# Patient Record
Sex: Female | Born: 1940 | Race: Black or African American | Hispanic: No | Smoking: Former smoker
Health system: Southern US, Community
[De-identification: ages and names within clinical notes are randomized; demographics above are authoritative.]

## PROBLEM LIST (undated history)

## (undated) DIAGNOSIS — I4891 Unspecified atrial fibrillation: Secondary | ICD-10-CM

## (undated) DIAGNOSIS — T753XXA Motion sickness, initial encounter: Secondary | ICD-10-CM

## (undated) DIAGNOSIS — I671 Cerebral aneurysm, nonruptured: Secondary | ICD-10-CM

## (undated) DIAGNOSIS — E785 Hyperlipidemia, unspecified: Secondary | ICD-10-CM

## (undated) DIAGNOSIS — Z972 Presence of dental prosthetic device (complete) (partial): Secondary | ICD-10-CM

## (undated) DIAGNOSIS — I10 Essential (primary) hypertension: Secondary | ICD-10-CM

## (undated) HISTORY — PX: BRAIN SURGERY: SHX531

## (undated) HISTORY — PX: BACK SURGERY: SHX140

## (undated) HISTORY — DX: Unspecified atrial fibrillation: I48.91

## (undated) HISTORY — PX: ABDOMINAL HYSTERECTOMY: SHX81

## (undated) HISTORY — PX: CEREBRAL ANEURYSM REPAIR: SHX164

## (undated) HISTORY — PX: CHOLECYSTECTOMY: SHX55

---

## 1994-10-25 DIAGNOSIS — I671 Cerebral aneurysm, nonruptured: Secondary | ICD-10-CM

## 1994-10-25 HISTORY — DX: Cerebral aneurysm, nonruptured: I67.1

## 2010-08-31 ENCOUNTER — Ambulatory Visit: Payer: Self-pay | Admitting: Internal Medicine

## 2016-07-05 ENCOUNTER — Other Ambulatory Visit (HOSPITAL_COMMUNITY): Payer: Self-pay | Admitting: Nurse Practitioner

## 2016-07-05 DIAGNOSIS — M25551 Pain in right hip: Secondary | ICD-10-CM

## 2016-07-12 ENCOUNTER — Ambulatory Visit (HOSPITAL_COMMUNITY): Payer: Medicare HMO

## 2017-11-25 ENCOUNTER — Emergency Department: Payer: Medicare HMO

## 2017-11-25 ENCOUNTER — Other Ambulatory Visit: Payer: Self-pay

## 2017-11-25 ENCOUNTER — Encounter: Payer: Self-pay | Admitting: Emergency Medicine

## 2017-11-25 DIAGNOSIS — I4891 Unspecified atrial fibrillation: Secondary | ICD-10-CM | POA: Insufficient documentation

## 2017-11-25 DIAGNOSIS — Z87891 Personal history of nicotine dependence: Secondary | ICD-10-CM | POA: Insufficient documentation

## 2017-11-25 DIAGNOSIS — I10 Essential (primary) hypertension: Secondary | ICD-10-CM | POA: Insufficient documentation

## 2017-11-25 DIAGNOSIS — Z7901 Long term (current) use of anticoagulants: Secondary | ICD-10-CM | POA: Diagnosis not present

## 2017-11-25 DIAGNOSIS — Z7951 Long term (current) use of inhaled steroids: Secondary | ICD-10-CM | POA: Insufficient documentation

## 2017-11-25 DIAGNOSIS — E785 Hyperlipidemia, unspecified: Secondary | ICD-10-CM | POA: Diagnosis not present

## 2017-11-25 DIAGNOSIS — G459 Transient cerebral ischemic attack, unspecified: Principal | ICD-10-CM | POA: Insufficient documentation

## 2017-11-25 DIAGNOSIS — Z7982 Long term (current) use of aspirin: Secondary | ICD-10-CM | POA: Insufficient documentation

## 2017-11-25 LAB — PROTIME-INR
INR: 0.87
Prothrombin Time: 11.7 seconds (ref 11.4–15.2)

## 2017-11-25 LAB — CBC WITH DIFFERENTIAL/PLATELET
BASOS PCT: 1 %
Basophils Absolute: 0 10*3/uL (ref 0–0.1)
Eosinophils Absolute: 0.1 10*3/uL (ref 0–0.7)
Eosinophils Relative: 3 %
HEMATOCRIT: 43 % (ref 35.0–47.0)
HEMOGLOBIN: 14.3 g/dL (ref 12.0–16.0)
Lymphocytes Relative: 45 %
Lymphs Abs: 1.6 10*3/uL (ref 1.0–3.6)
MCH: 28.4 pg (ref 26.0–34.0)
MCHC: 33.2 g/dL (ref 32.0–36.0)
MCV: 85.5 fL (ref 80.0–100.0)
Monocytes Absolute: 0.2 10*3/uL (ref 0.2–0.9)
Monocytes Relative: 6 %
NEUTROS ABS: 1.5 10*3/uL (ref 1.4–6.5)
NEUTROS PCT: 45 %
Platelets: 268 10*3/uL (ref 150–440)
RBC: 5.03 MIL/uL (ref 3.80–5.20)
RDW: 15 % — ABNORMAL HIGH (ref 11.5–14.5)
WBC: 3.4 10*3/uL — ABNORMAL LOW (ref 3.6–11.0)

## 2017-11-25 LAB — TROPONIN I: Troponin I: <0.03 ng/mL (ref <0.03)

## 2017-11-25 LAB — COMPREHENSIVE METABOLIC PANEL
ALBUMIN: 4.3 g/dL (ref 3.5–5.0)
ALK PHOS: 54 U/L (ref 38–126)
ALT: 16 U/L (ref 14–54)
AST: 22 U/L (ref 15–41)
Anion gap: 6 (ref 5–15)
BILIRUBIN TOTAL: 0.4 mg/dL (ref 0.3–1.2)
BUN: 17 mg/dL (ref 6–20)
CALCIUM: 9.5 mg/dL (ref 8.9–10.3)
CO2: 28 mmol/L (ref 22–32)
Chloride: 107 mmol/L (ref 101–111)
Creatinine, Ser: 0.96 mg/dL (ref 0.44–1.00)
GFR calc Af Amer: >60 mL/min (ref >60)
GFR calc non Af Amer: 57 mL/min — ABNORMAL LOW (ref >60)
GLUCOSE: 93 mg/dL (ref 65–99)
Potassium: 3.7 mmol/L (ref 3.5–5.1)
Sodium: 141 mmol/L (ref 135–145)
TOTAL PROTEIN: 7.8 g/dL (ref 6.5–8.1)

## 2017-11-25 LAB — GLUCOSE, CAPILLARY: Glucose-Capillary: 84 mg/dL (ref 65–99)

## 2017-11-25 LAB — APTT: aPTT: 27 s (ref 24–36)

## 2017-11-25 NOTE — ED Triage Notes (Signed)
Pt to ED via POV and states that she was sent to ED by her PCP. Pt states that yesterday she had numbness in her right arm and tingling in the right side of her face. Pt denies visual changes or changes in her speech. Pt states that she did feel weaker than normal. Pt states that the numbness and tingling did not last not but is unable to give time frame in triage. Pt in NAD at this time. Grips equal bilaterally, no facial droop noted.

## 2017-11-26 ENCOUNTER — Encounter: Payer: Self-pay | Admitting: Internal Medicine

## 2017-11-26 ENCOUNTER — Observation Stay: Payer: Medicare HMO

## 2017-11-26 ENCOUNTER — Observation Stay (HOSPITAL_BASED_OUTPATIENT_CLINIC_OR_DEPARTMENT_OTHER)
Admit: 2017-11-26 | Discharge: 2017-11-26 | Disposition: A | Discharge status: Home / Self Care | Payer: Medicare HMO | Attending: Internal Medicine | Admitting: Internal Medicine

## 2017-11-26 ENCOUNTER — Observation Stay
Admission: EM | Admit: 2017-11-26 | Discharge: 2017-11-27 | Disposition: A | Discharge status: Home / Self Care | Payer: Medicare HMO | Attending: Family Medicine | Admitting: Family Medicine

## 2017-11-26 DIAGNOSIS — G459 Transient cerebral ischemic attack, unspecified: Secondary | ICD-10-CM | POA: Diagnosis not present

## 2017-11-26 HISTORY — DX: Hyperlipidemia, unspecified: E78.5

## 2017-11-26 HISTORY — DX: Essential (primary) hypertension: I10

## 2017-11-26 HISTORY — DX: Cerebral aneurysm, nonruptured: I67.1

## 2017-11-26 LAB — ECHOCARDIOGRAM COMPLETE
HEIGHTINCHES: 68 in
WEIGHTICAEL: 3032 [oz_av]

## 2017-11-26 LAB — LIPID PANEL
CHOL/HDL RATIO: 4 ratio
CHOLESTEROL: 183 mg/dL (ref 0–200)
HDL: 46 mg/dL (ref >40)
LDL Cholesterol: 119 mg/dL — ABNORMAL HIGH (ref 0–99)
Triglycerides: 88 mg/dL (ref <150)
VLDL: 18 mg/dL (ref 0–40)

## 2017-11-26 MED ORDER — STROKE: EARLY STAGES OF RECOVERY BOOK
Freq: Once | Status: AC
  Administered 2017-11-26: 03:00:00

## 2017-11-26 MED ORDER — ACETAMINOPHEN 160 MG/5ML PO SOLN
650.0000 mg | ORAL | Status: DC | PRN
  Filled 2017-11-26: qty 20.3

## 2017-11-26 MED ORDER — LISINOPRIL 20 MG PO TABS
20.0000 mg | ORAL_TABLET | Freq: Every day | ORAL | Status: DC
  Administered 2017-11-27: 08:00:00 20 mg via ORAL
  Filled 2017-11-26 (×2): qty 1

## 2017-11-26 MED ORDER — ACETAMINOPHEN 325 MG PO TABS: 650.0000 mg | ORAL_TABLET | ORAL | Status: DC | PRN

## 2017-11-26 MED ORDER — SODIUM CHLORIDE 0.9 % IV SOLN: INTRAVENOUS | Status: DC

## 2017-11-26 MED ORDER — SENNOSIDES-DOCUSATE SODIUM 8.6-50 MG PO TABS: 1.0000 | ORAL_TABLET | Freq: Every evening | ORAL | Status: DC | PRN

## 2017-11-26 MED ORDER — ACETAMINOPHEN 650 MG RE SUPP: 650.0000 mg | RECTAL | Status: DC | PRN

## 2017-11-26 MED ORDER — FLUTICASONE PROPIONATE 50 MCG/ACT NA SUSP
1.0000 | Freq: Every day | NASAL | Status: DC | PRN
  Filled 2017-11-26: qty 16

## 2017-11-26 MED ORDER — ASPIRIN 300 MG RE SUPP: 300.0000 mg | Freq: Every day | RECTAL | Status: DC

## 2017-11-26 MED ORDER — VITAMIN D 1000 UNITS PO TABS
1000.0000 [IU] | ORAL_TABLET | Freq: Every day | ORAL | Status: DC
  Administered 2017-11-27: 08:00:00 1000 [IU] via ORAL
  Filled 2017-11-26 (×2): qty 1

## 2017-11-26 MED ORDER — ASPIRIN 325 MG PO TABS
325.0000 mg | ORAL_TABLET | Freq: Every day | ORAL | Status: DC
  Administered 2017-11-27: 325 mg via ORAL
  Filled 2017-11-26 (×2): qty 1

## 2017-11-26 MED ORDER — OMEGA-3-ACID ETHYL ESTERS 1 G PO CAPS
1.0000 g | ORAL_CAPSULE | Freq: Every day | ORAL | Status: DC
  Administered 2017-11-27: 1 g via ORAL
  Filled 2017-11-26 (×2): qty 1

## 2017-11-26 MED ORDER — AMLODIPINE BESYLATE 10 MG PO TABS
10.0000 mg | ORAL_TABLET | Freq: Every day | ORAL | Status: DC
  Administered 2017-11-27: 10 mg via ORAL
  Filled 2017-11-26 (×2): qty 1

## 2017-11-26 MED ORDER — ALPRAZOLAM 0.25 MG PO TABS: 0.2500 mg | ORAL_TABLET | Freq: Every evening | ORAL | Status: DC | PRN

## 2017-11-26 NOTE — Progress Notes (Signed)
PT Cancellation Note  Patient Details Name: Kayla Vaughn MRN: [id number] DOB: 03/01/1941   Cancelled Treatment:    Reason Eval/Treat Not Completed: Other (comment).  Declined PT and may not need anything.  Will ck in AM to see if she is still fine then DC order.   Ivar DrapeRuth E Ramata Strothman 11/26/2017, 4:05 PM   Samul Dadauth Shakiara Lukic, PT MS Acute Rehab Dept. Number: [facility] [phone] and [facility] [phone]

## 2017-11-26 NOTE — Progress Notes (Signed)
OT Cancellation Note  Patient Details Name: Derald MacleodDilsey S Daigle MRN: [id number] DOB: 12/24/1940   Cancelled Treatment:    Reason Eval/Treat Not Completed: Patient at procedure or test/ unavailable(Pt. at testing in a.m. WIll attempt at a later time/date.)  Olegario MessierElaine Faiza Bansal, MS, OTR/L 11/26/2017, 10:38 AM

## 2017-11-26 NOTE — Progress Notes (Signed)
*  PRELIMINARY RESULTS* Echocardiogram 2D Echocardiogram has been performed.  Garrel Ridgelikeshia S Jonaven Hilgers 11/26/2017, 3:00 PM

## 2017-11-26 NOTE — Progress Notes (Signed)
Spoke with Dr. Tobi BastosPyreddy about pt claustophia and MRI order. Pt report she will need sedation or open MRI. Dr. Tobi BastosPyreddy gave orders to discontinue the current MRI orders and they will deal with it in am. No new orders received. Pt is aware. Will continue to monitor.

## 2017-11-26 NOTE — Progress Notes (Signed)
77 year old female patient with history of brain aneurysm, hypertension, hyperlipidemia presented to the emergency room with numbness of the right hand and tingling in the right side of face.  Admitting diagnosis 1.  Transient ischemic attack 2.  New onset atrial fibrillation 3.  Hypertension 4.  Hyperlipidemia Treatment plan Admit patient to medical floor observation bed Start patient on oral aspirin Neurology and cardiology consultation Check echocardiogram and carotid ultrasound Patient prefers open MRI Sequential compression device to lower extremities for DVT prophylaxis  Agree with above stated plan

## 2017-11-26 NOTE — ED Provider Notes (Signed)
[facility] Emergency Department Provider Note  ____________________________________________   First MD Initiated Contact with Patient 11/26/17 0000     (approximate)  I have reviewed the triage vital signs and the nursing notes.   HISTORY  Chief Complaint Numbness   HPI Kayla Vaughn is a 77 y.o. female who sent to the emergency department by her primary care physician for an episode of numbness in her right face and right hand that occurred yesterday.  She said it lasted maybe 15-20 minutes.  And initially began in her face and then progressed to her right hand which is her dominant hand.  She tried to pick up a cup of coffee and she felt her hand was clumsy and she was unable to pick it up.  It resolved on its own.  She has never had a stroke before although in 1996 she did have a craniotomy and a clipping of an aneurysm.  She currently takes aspirin every day.  She has no cardiac history no history of arrhythmia.  She currently feels well.  She is asymptomatic.  Her symptoms seem to begin quickly and suddenly.  They went away quickly on their own.  Nothing in particular seemed to make them better or worse.  Past Medical History:  Diagnosis Date  . Brain aneurysm   . Hyperlipemia   . Hypertension     Patient Active Problem List   Diagnosis Date Noted  . TIA (transient ischemic attack) 11/26/2017    Past Surgical History:  Procedure Laterality Date  . ABDOMINAL HYSTERECTOMY    . BACK SURGERY    . BRAIN SURGERY    . CHOLECYSTECTOMY      Prior to Admission medications   Medication Sig Start Date End Date Taking? Authorizing Provider  ALPRAZolam (XANAX) 0.25 MG tablet Take 0.25 mg by mouth. Prn when traveling for panic attack per pt   Yes [provider]  amLODipine (NORVASC) 10 MG tablet Take 10 mg by mouth daily.   Yes [provider]  aspirin 81 MG chewable tablet Chew by mouth daily.   Yes [provider]    cholecalciferol (VITAMIN D) 1000 units tablet Take 1,000 Units by mouth daily.   Yes [provider]  fluticasone (FLONASE) 50 MCG/ACT nasal spray Place 1 spray into both nostrils daily as needed for allergies or rhinitis.   Yes [provider]  ibuprofen (ADVIL,MOTRIN) 800 MG tablet Take 800 mg by mouth every 8 (eight) hours as needed.   Yes [provider]  lisinopril (PRINIVIL,ZESTRIL) 20 MG tablet Take 20 mg by mouth daily.   Yes [provider]  omega-3 acid ethyl esters (LOVAZA) 1 g capsule Take 1 g by mouth daily.   Yes [provider]  apixaban (ELIQUIS) 5 MG TABS tablet Take 1 tablet (5 mg total) by mouth 2 (two) times daily. 11/27/17   Salary, Evelena AsaMontell D, MD  atorvastatin (LIPITOR) 20 MG tablet Take 1 tablet (20 mg total) by mouth daily. 11/27/17 11/27/18  Salary, Evelena AsaMontell D, MD    Allergies Sulfur  Family History  Problem Relation Age of Onset  . Breast cancer Maternal Grandmother     Social History Social History   Tobacco Use  . Smoking status: Former Games developermoker  . Smokeless tobacco: Never Used  Substance Use Topics  . Alcohol use: No    Frequency: Never  . Drug use: No    Review of Systems Constitutional: No fever/chills Eyes: No visual changes. ENT: No  sore throat. Cardiovascular: Denies chest pain. Respiratory: Denies shortness of breath. Gastrointestinal: No abdominal pain.  No nausea, no vomiting.  No diarrhea.  No constipation. Genitourinary: Negative for dysuria. Musculoskeletal: Negative for back pain. Skin: Negative for rash. Neurological positive for focal numbness   ____________________________________________   PHYSICAL EXAM:  VITAL SIGNS: ED Triage Vitals [11/25/17 1814]  Enc Vitals Group     BP (!) 150/90     Pulse Rate 86     Resp 16     Temp 98.4 F (36.9 C)     Temp Source Oral     SpO2 100 %     Weight 190 lb (86.2 kg)     Height 5\' 7"  (1.702 m)     Head Circumference      Peak Flow       Pain Score      Pain Loc      Pain Edu?      Excl. in GC?     Constitutional: Alert and oriented x4 well-appearing nontoxic no diaphoresis speaks in full clear sentences Eyes: PERRL EOMI. mid range and brisk Head: Atraumatic. Nose: No congestion/rhinnorhea. Mouth/Throat: No trismus Neck: No stridor.   Cardiovascular: Normal rate, regular rhythm. Grossly normal heart sounds.  Good peripheral circulation. Respiratory: Normal respiratory effort.  No retractions. Lungs CTAB and moving good air Gastrointestinal: Soft nontender Musculoskeletal: No lower extremity edema   Neurologic:  Normal speech and language.   Skin:  Skin is warm, dry and intact. No rash noted. Psychiatric: Mood and affect are normal. Speech and behavior are normal.    ____________________________________________   DIFFERENTIAL includes but not limited to  Stroke, TIA, metabolic derangement, urinary tract infection, intracerebral hemorrhage ____________________________________________   LABS (all labs ordered are listed, but only abnormal results are displayed)  Labs Reviewed  CBC WITH DIFFERENTIAL/PLATELET - Abnormal; Notable for the following components:      Result Value   WBC 3.4 (*)    RDW 15.0 (*)    All other components within normal limits  COMPREHENSIVE METABOLIC PANEL - Abnormal; Notable for the following components:   GFR calc non Af Amer 57 (*)    All other components within normal limits  HEMOGLOBIN A1C - Abnormal; Notable for the following components:   Hgb A1c MFr Bld 6.1 (*)    All other components within normal limits  LIPID PANEL - Abnormal; Notable for the following components:   LDL Cholesterol 119 (*)    All other components within normal limits  PROTIME-INR  APTT  TROPONIN I  GLUCOSE, CAPILLARY  CBG MONITORING, ED    Work reviewed by me with no acute disease __________________________________________  EKG  ED ECG REPORT I, Merrily Brittle, the attending physician,  personally viewed and interpreted this ECG.  Date: 11/26/2017 EKG Time:  Rate: 97 Rhythm: Atrial fibrillation QRS Axis: Leftward axis Intervals: normal ST/T Wave abnormalities: normal Narrative Interpretation: no evidence of acute ischemia  ____________________________________________  RADIOLOGY  Head CT reviewed by me with chronic changes but no acute disease ____________________________________________   PROCEDURES  Procedure(s) performed: no  Procedures  Critical Care performed: o  Observation: no ____________________________________________   INITIAL IMPRESSION / ASSESSMENT AND PLAN / ED COURSE  Pertinent labs & imaging results that were available during my care of the patient were reviewed by me and considered in my medical decision making (see chart for details).  The patient is currently neuro intact although she had what sounds like a transient ischemic attack yesterday.  She  is currently in new onset atrial fibrillation although rate controlled.  She is already taking a daily aspirin.  I have a call out to neurology to discuss.     ----------------------------------------- 12:35 AM on 11/26/2017 -----------------------------------------  I discussed the case with on-call neurologist Dr. Otelia Limes who indicated that the standard of care for atrial fibrillation is actually anticoagulation and not just antiplatelet.  Given her history of a previous clipping of an aneurysm MRI may not be possible but he does recommend inpatient admission for further evaluation of atrial fibrillation and further neuro imaging as well as formal neurology consultation in the morning for anticoagulation recommendations.  He recommends aspirin in the time being. ____________________________________________   FINAL CLINICAL IMPRESSION(S) / ED DIAGNOSES  Final diagnoses:  TIA (transient ischemic attack)      NEW MEDICATIONS STARTED DURING THIS VISIT:  Discharge Medication List as  of 11/27/2017 10:16 AM    START taking these medications   Details  apixaban (ELIQUIS) 5 MG TABS tablet Take 1 tablet (5 mg total) by mouth 2 (two) times daily., Starting Sun 11/27/2017, Print         Note:  This document was prepared using Dragon voice recognition software and may include unintentional dictation errors.     Merrily Brittle, MD 11/28/17 [phone]

## 2017-11-26 NOTE — H&P (Signed)
[facility] [place] [facility]   PATIENT NAME: Kayla Vaughn    MR#:  [id number]  DATE OF BIRTH:  03-19-1941  DATE OF ADMISSION:  11/26/2017  PRIMARY CARE PHYSICIAN: Lucia Estelle, NP   REQUESTING/REFERRING PHYSICIAN:   CHIEF COMPLAINT:   Chief Complaint  Patient presents with  . Numbness    HISTORY OF PRESENT ILLNESS: Kayla Vaughn  is a 77 y.o. female with a known history of brain aneurysm, hyperlipidemia, hypertension presented to the emergency room with tingling in the right side of the face and right hand numbness.  The following symptoms started a day ago and she went to the clinic to see her doctor who referred her to the emergency room.  Patient also felt her heart was racing and felt palpitations since last 24 hours.  She was evaluated in the emergency room was found to be in atrial fibrillation.  She was worked up with CT head which showed no acute abnormality.  Patient has claustrophobia and does not want a closed MRI scan.  She prefers open MRI scan.  No complaints of any seizures.  No headache, dizziness and blurry vision.  Hospitalist service was consulted.  Patient had a clipping of the brain aneurysm done 25 years ago.  PAST MEDICAL HISTORY:   Past Medical History:  Diagnosis Date  . Brain aneurysm   . Hyperlipemia   . Hypertension     PAST SURGICAL HISTORY:  Past Surgical History:  Procedure Laterality Date  . ABDOMINAL HYSTERECTOMY    . BACK SURGERY    . BRAIN SURGERY    . CHOLECYSTECTOMY      SOCIAL HISTORY:  Social History   Tobacco Use  . Smoking status: Former Games developer  . Smokeless tobacco: Never Used  Substance Use Topics  . Alcohol use: No    Frequency: Never    FAMILY HISTORY:  Family History  Problem Relation Age of Onset  . Breast cancer Maternal Grandmother     DRUG ALLERGIES: No Known Allergies  REVIEW OF SYSTEMS:   CONSTITUTIONAL: No fever, fatigue or weakness.  EYES: No blurred or double vision.  EARS,  NOSE, AND THROAT: No tinnitus or ear pain.  RESPIRATORY: No cough, shortness of breath, wheezing or hemoptysis.  CARDIOVASCULAR: No chest pain, orthopnea, edema.  Has palpitations GASTROINTESTINAL: No nausea, vomiting, diarrhea or abdominal pain.  GENITOURINARY: No dysuria, hematuria.  ENDOCRINE: No polyuria, nocturia,  HEMATOLOGY: No anemia, easy bruising or bleeding SKIN: No rash or lesion. MUSCULOSKELETAL: No joint pain or arthritis.   NEUROLOGIC:  Tingling right side of face, numbness of right hand,  No weakness.  PSYCHIATRY: No anxiety or depression.   MEDICATIONS AT HOME:  Prior to Admission medications   Medication Sig Start Date End Date Taking? Authorizing Provider  ALPRAZolam (XANAX) 0.25 MG tablet Take 0.25 mg by mouth. Prn when traveling for panic attack per pt   Yes [provider]  amLODipine (NORVASC) 10 MG tablet Take 10 mg by mouth daily.   Yes [provider]  aspirin 81 MG chewable tablet Chew by mouth daily.   Yes [provider]  cholecalciferol (VITAMIN D) 1000 units tablet Take 1,000 Units by mouth daily.   Yes [provider]  fluticasone (FLONASE) 50 MCG/ACT nasal spray Place 1 spray into both nostrils daily as needed for allergies or rhinitis.   Yes [provider]  ibuprofen (ADVIL,MOTRIN) 800 MG tablet Take 800 mg by mouth every 8 (eight) hours as needed.  Yes [provider]  lisinopril (PRINIVIL,ZESTRIL) 20 MG tablet Take 20 mg by mouth daily.   Yes [provider]  omega-3 acid ethyl esters (LOVAZA) 1 g capsule Take 1 g by mouth daily.   Yes [provider]      PHYSICAL EXAMINATION:   VITAL SIGNS: Blood pressure (!) 136/94, pulse 82, temperature 98.4 F (36.9 C), temperature source Oral, resp. rate 16, height 5\' 7"  (1.702 m), weight 86.2 kg (190 lb), SpO2 95 %.  GENERAL:  77 y.o.-year-old patient lying in the bed with no acute distress.  EYES: Pupils equal, round, reactive to  light and accommodation. No scleral icterus. Extraocular muscles intact.  HEENT: Head atraumatic, normocephalic. Oropharynx and nasopharynx clear.  NECK:  Supple, no jugular venous distention. No thyroid enlargement, no tenderness.  LUNGS: Normal breath sounds bilaterally, no wheezing, rales,rhonchi or crepitation. No use of accessory muscles of respiration.  CARDIOVASCULAR: S1, S2 normal. No murmurs, rubs, or gallops.  ABDOMEN: Soft, nontender, nondistended. Bowel sounds present. No organomegaly or mass.  EXTREMITIES: No pedal edema, cyanosis, or clubbing.  NEUROLOGIC: Cranial nerves II through XII are intact. Muscle strength 5/5 in all extremities. Sensation intact. Gait not checked.  PSYCHIATRIC: The patient is alert and oriented x 3.  SKIN: No obvious rash, lesion, or ulcer.   LABORATORY PANEL:   CBC Recent Labs  Lab 11/25/17 1812  WBC 3.4*  HGB 14.3  HCT 43.0  PLT 268  MCV 85.5  MCH 28.4  MCHC 33.2  RDW 15.0*  LYMPHSABS 1.6  MONOABS 0.2  EOSABS 0.1  BASOSABS 0.0   ------------------------------------------------------------------------------------------------------------------  Chemistries  Recent Labs  Lab 11/25/17 1812  NA 141  K 3.7  CL 107  CO2 28  GLUCOSE 93  BUN 17  CREATININE 0.96  CALCIUM 9.5  AST 22  ALT 16  ALKPHOS 54  BILITOT 0.4   ------------------------------------------------------------------------------------------------------------------ estimated creatinine clearance is 58 mL/min (by C-G formula based on SCr of 0.96 mg/dL). ------------------------------------------------------------------------------------------------------------------ No results for input(s): TSH, T4TOTAL, T3FREE, THYROIDAB in the last 72 hours.  Invalid input(s): FREET3   Coagulation profile Recent Labs  Lab 11/25/17 1812  INR 0.87   ------------------------------------------------------------------------------------------------------------------- No results  for input(s): DDIMER in the last 72 hours. -------------------------------------------------------------------------------------------------------------------  Cardiac Enzymes Recent Labs  Lab 11/25/17 1812  TROPONINI <0.03   ------------------------------------------------------------------------------------------------------------------ Invalid input(s): POCBNP  ---------------------------------------------------------------------------------------------------------------  Urinalysis No results found for: COLORURINE, APPEARANCEUR, LABSPEC, PHURINE, GLUCOSEU, HGBUR, BILIRUBINUR, KETONESUR, PROTEINUR, UROBILINOGEN, NITRITE, LEUKOCYTESUR   RADIOLOGY: Ct Head Wo Contrast  Result Date: 11/25/2017 CLINICAL DATA:  RIGHT-sided sensory symptoms which developed yesterday. Evaluate for stroke. EXAM: CT HEAD WITHOUT CONTRAST TECHNIQUE: Contiguous axial images were obtained from the base of the skull through the vertex without intravenous contrast. COMPARISON:  None. 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 An aneurysm clip is seen on the LEFT, PICA origin. Small silver clips are seen elsewhere in the cerebellum. 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 Overall volume of the cerebral hemispheres appears roughly normal for age. 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 There are no areas definite acute infarction, acute hemorrhage, mass lesion, hydrocephalus, or extra-axial fluid. The fourth ventricle is enlarged but does not appear trapped, rather the appearance is due to ex vacuo dilatation. Vascular: Calcification of the cavernous internal carotid arteries consistent with cerebrovascular atherosclerotic disease. No signs of intracranial  large vessel occlusion.  Skull: Postsurgical change.  No fracture. Sinuses/Orbits: No acute finding. Other: None. IMPRESSION: Previous craniotomy/craniectomy for LEFT PICA aneurysm. Postsurgical changes as described. No acute intracranial abnormality is detected. Electronically Signed   By: Elsie Stain M.D.   On: 11/25/2017 19:30    EKG: Orders placed or performed during the hospital encounter of 11/26/17  . ED EKG  . ED EKG    IMPRESSION AND PLAN: 77 year old female patient with history of brain aneurysm, hypertension, hyperlipidemia presented to the emergency room with numbness of the right hand and tingling in the right side of face.  Admitting diagnosis 1.  Transient ischemic attack 2.  New onset atrial fibrillation 3.  Hypertension 4.  Hyperlipidemia Treatment plan Admit patient to medical floor observation bed Start patient on oral aspirin Neurology and cardiology consultation Check echocardiogram and carotid ultrasound Patient prefers open MRI Sequential compression device to lower extremities for DVT prophylaxis  All the records are reviewed and case discussed with ED provider. Management plans discussed with the patient, family and they are in agreement.  CODE STATUS:FULL CODE Code Status History    This patient does not have a recorded code status. Please follow your organizational policy for patients in this situation.    Advance Directive Documentation     Most Recent Value  Type of Advance Directive  Living will  Pre-existing out of facility DNR order (yellow form or pink MOST form)  No data  "MOST" Form in Place?  No data       TOTAL TIME TAKING CARE OF THIS PATIENT: 50 minutes.    Ihor Austin M.D on 11/26/2017 at 1:28 AM  Between 7am to 6pm - Pager - [phone]  After 6pm go to [email or web address] - password [email or web address] [facility]  [place] [facility]  Office  [phone]  CC: Primary care physician; Lucia Estelle, NP

## 2017-11-26 NOTE — Plan of Care (Signed)
  Progressing Education: Knowledge of disease or condition will improve 11/26/2017 1551 - Progressing by Kathreen CosierMalcolm, Aarya Quebedeaux A, RN Knowledge of secondary prevention will improve 11/26/2017 1551 - Progressing by Kathreen CosierMalcolm, Mirely Pangle A, RN Coping: Will verbalize positive feelings about self 11/26/2017 1551 - Progressing by Kathreen CosierMalcolm, Jayshawn Colston A, RN Will identify appropriate support needs 11/26/2017 1551 - Progressing by Kathreen CosierMalcolm, Mailin Coglianese A, RN Ischemic Stroke/TIA Tissue Perfusion: Complications of ischemic stroke/TIA will be minimized 11/26/2017 1551 - Progressing by Kathreen CosierMalcolm, Kazim Corrales A, RN Health Behavior/Discharge Planning: Ability to manage health-related needs will improve 11/26/2017 1551 - Progressing by Kathreen CosierMalcolm, Jayland Null A, RN Clinical Measurements: Ability to maintain clinical measurements within normal limits will improve 11/26/2017 1551 - Progressing by Kathreen CosierMalcolm, Aria Pickrell A, RN Will remain free from infection 11/26/2017 1551 - Progressing by Kathreen CosierMalcolm, Kamaryn Grimley A, RN Diagnostic test results will improve 11/26/2017 1551 - Progressing by Kathreen CosierMalcolm, Yehoshua Vitelli A, RN Respiratory complications will improve 11/26/2017 1551 - Progressing by Kathreen CosierMalcolm, Jaki Hammerschmidt A, RN Cardiovascular complication will be avoided 11/26/2017 1551 - Progressing by Kathreen CosierMalcolm, Duffy Dantonio A, RN Pain Managment: General experience of comfort will improve 11/26/2017 1551 - Progressing by Kathreen CosierMalcolm, Lamarion Mcevers A, RN Safety: Ability to remain free from injury will improve 11/26/2017 1551 - Progressing by Kathreen CosierMalcolm, Ashtynn Berke A, RN Skin Integrity: Risk for impaired skin integrity will decrease 11/26/2017 1551 - Progressing by Kathreen CosierMalcolm, Jirah Rider A, RN

## 2017-11-26 NOTE — Progress Notes (Signed)
SLP Cancellation Note  Patient Details Name: Kayla Vaughn MRN: [id number] DOB: [birth year]   Cancelled treatment:       Reason Eval/Treat Not Completed: SLP screened, no needs identified, will sign off(chart reviewed; NSG consulted. Met w/ pt/family.) Pt denied any difficulty swallowing and is currently on a regular diet; tolerates swallowing pills w/ water per NSG. Pt conversed at conversational level w/out deficits noted; pt and family denied any speech-language deficits.  No further skilled ST services indicated as pt appears at her baseline. Pt agreed. NSG to reconsult if any change in status.    Orinda Kenner, MS, CCC-SLP Watson,Katherine 11/26/2017, 9:00 AM

## 2017-11-27 LAB — HEMOGLOBIN A1C
Hgb A1c MFr Bld: 6.1 % — ABNORMAL HIGH (ref 4.8–5.6)
Mean Plasma Glucose: 128.37 mg/dL

## 2017-11-27 MED ORDER — ATORVASTATIN CALCIUM 20 MG PO TABS: 20.0000 mg | ORAL_TABLET | Freq: Every day | ORAL | 0 refills | Status: DC

## 2017-11-27 MED ORDER — APIXABAN 5 MG PO TABS: 5.0000 mg | ORAL_TABLET | Freq: Two times a day (BID) | ORAL | 0 refills | Status: DC

## 2017-11-27 MED ORDER — CLOPIDOGREL BISULFATE 75 MG PO TABS: 75.0000 mg | ORAL_TABLET | Freq: Every day | ORAL | 0 refills | Status: DC

## 2017-11-27 MED ORDER — CLOPIDOGREL BISULFATE 75 MG PO TABS: 75.0000 mg | ORAL_TABLET | Freq: Every day | ORAL | 11 refills | Status: DC

## 2017-11-27 NOTE — Care Management Note (Signed)
Case Management Note  Patient Details  Name: Kayla Vaughn MRN: [id number] Date of Birth: 11/15/1940  Subjective/Objective:                    Action/Plan:   Expected Discharge Date:                  Expected Discharge Plan:     In-House Referral:     Discharge planning Services     Post Acute Care Choice:    Choice offered to:     DME Arranged:    DME Agency:     HH Arranged:    HH Agency:     Status of Service:     If discussed at [employer] Length of Stay Meetings, dates discussed:    Additional Comments:  Rmani Kellogg A, RN 11/27/2017, 9:07 AM

## 2017-11-27 NOTE — Progress Notes (Signed)
Kayla Vaughn to be D/C'd Home per MD order.  Discussed prescriptions and follow up appointments with the patient. Prescriptions given to patient, medication list explained in detail. Pt verbalized understanding.  Allergies as of 11/27/2017      Reactions   Sulfur       Medication List    TAKE these medications   ALPRAZolam 0.25 MG tablet Commonly known as:  XANAX Take 0.25 mg by mouth. Prn when traveling for panic attack per pt   amLODipine 10 MG tablet Commonly known as:  NORVASC Take 10 mg by mouth daily.   apixaban 5 MG Tabs tablet Commonly known as:  ELIQUIS Take 1 tablet (5 mg total) by mouth 2 (two) times daily.   aspirin 81 MG chewable tablet Chew by mouth daily.   atorvastatin 20 MG tablet Commonly known as:  LIPITOR Take 1 tablet (20 mg total) by mouth daily.   cholecalciferol 1000 units tablet Commonly known as:  VITAMIN D Take 1,000 Units by mouth daily.   fluticasone 50 MCG/ACT nasal spray Commonly known as:  FLONASE Place 1 spray into both nostrils daily as needed for allergies or rhinitis.   ibuprofen 800 MG tablet Commonly known as:  ADVIL,MOTRIN Take 800 mg by mouth every 8 (eight) hours as needed.   lisinopril 20 MG tablet Commonly known as:  PRINIVIL,ZESTRIL Take 20 mg by mouth daily.   omega-3 acid ethyl esters 1 g capsule Commonly known as:  LOVAZA Take 1 g by mouth daily.       Vitals:   11/27/17 0426 11/27/17 0800  BP: (!) 144/92 123/72  Pulse: 64 (!) 52  Resp: 16 18  Temp: 97.7 F (36.5 C) 98 F (36.7 C)  SpO2: 97% 96%    Tele box removed and returned.Skin clean, dry and intact without evidence of skin break down, no evidence of skin tears noted. IV catheter discontinued intact. Site without signs and symptoms of complications. Dressing and pressure applied. Pt denies pain at this time. No complaints noted.  An After Visit Summary was printed and given to the patient. Patient escorted via WC, and D/C home via private  auto.  Rigoberto NoelErica Y Stacy Sailer

## 2017-11-27 NOTE — Care Management Obs Status (Signed)
MEDICARE OBSERVATION STATUS NOTIFICATION   Patient Details  Name: Kayla Vaughn MRN: [id number] Date of Birth: 09/10/1941   Medicare Observation Status Notification Given:  Yes    Aaralynn Shepheard A, RN 11/27/2017, 9:11 AM

## 2017-11-27 NOTE — Progress Notes (Signed)
PT Cancellation Note  Patient Details Name: Kayla Vaughn MRN: [id number] DOB: 01/16/1941   Cancelled Treatment:     PT chart reviewed and offered a chance to participate in PT evaluation.  Pt declined, stating that she doesn't feel that she needs therapy and that she is getting ready to go home.  Will DC orders today.   Glenetta HewSarah Findley Vi, PT, DPT  11/27/2017, 11:12 AM

## 2017-11-27 NOTE — Discharge Summary (Signed)
[facility] - [place] [facility]   PATIENT NAME: Kayla Vaughn    MR#:  [id number]  DATE OF BIRTH:  Jul 15, 1941  DATE OF ADMISSION:  11/26/2017 ADMITTING PHYSICIAN: Ihor Austin, MD  DATE OF DISCHARGE: No discharge date for patient encounter.  PRIMARY CARE PHYSICIAN: Lucia Estelle, NP    ADMISSION DIAGNOSIS:  TIA (transient ischemic attack) [G45.9]  DISCHARGE DIAGNOSIS:  Active Problems:   TIA (transient ischemic attack)   SECONDARY DIAGNOSIS:   Past Medical History:  Diagnosis Date  . Brain aneurysm   . Hyperlipemia   . Hypertension     HOSPITAL COURSE:  77 year old female patient with history of brain aneurysm, hypertension, hyperlipidemia presented to the emergency room with numbness of the right hand and tingling in the right side of face which did resolve, CT head was negative for any acute process, carotid Dopplers were negative for any hemodynamically significant stenosis, echocardiogram was normal, patient had preferred open MRI-not available, patient refused physical therapy while in house, given new onset A. fib-patient was started on Eliquis and statin therapy, patient follow-up with primary care provider in 3-5 days for reevaluation, neurology and cardiology were consulted while in house.   DISCHARGE CONDITIONS:  On day of discharge patient is afebrile, he dynamically stable, tolerating diet, ready for discharge home in care of family with appropriate follow-up with primary care provider in 3-5 days for reevaluation, for more specific details please see chart  CONSULTS OBTAINED:  Treatment Team:  Anson Fret, MD Thana Farr, MD  DRUG ALLERGIES:   Allergies  Allergen Reactions  . Sulfur     DISCHARGE MEDICATIONS:   Allergies as of 11/27/2017      Reactions   Sulfur       Medication List    STOP taking these medications   aspirin 81 MG chewable tablet     TAKE these medications   ALPRAZolam 0.25 MG  tablet Commonly known as:  XANAX Take 0.25 mg by mouth. Prn when traveling for panic attack per pt   amLODipine 10 MG tablet Commonly known as:  NORVASC Take 10 mg by mouth daily.   atorvastatin 20 MG tablet Commonly known as:  LIPITOR Take 1 tablet (20 mg total) by mouth daily.   cholecalciferol 1000 units tablet Commonly known as:  VITAMIN D Take 1,000 Units by mouth daily.   clopidogrel 75 MG tablet Commonly known as:  PLAVIX Take 1 tablet (75 mg total) by mouth daily.   fluticasone 50 MCG/ACT nasal spray Commonly known as:  FLONASE Place 1 spray into both nostrils daily as needed for allergies or rhinitis.   ibuprofen 800 MG tablet Commonly known as:  ADVIL,MOTRIN Take 800 mg by mouth every 8 (eight) hours as needed.   lisinopril 20 MG tablet Commonly known as:  PRINIVIL,ZESTRIL Take 20 mg by mouth daily.   omega-3 acid ethyl esters 1 g capsule Commonly known as:  LOVAZA Take 1 g by mouth daily.        DISCHARGE INSTRUCTIONS:    If you experience worsening of your admission symptoms, develop shortness of breath, life threatening emergency, suicidal or homicidal thoughts you must seek medical attention immediately by calling 911 or calling your MD immediately  if symptoms less severe.  You Must read complete instructions/literature along with all the possible adverse reactions/side effects for all the Medicines you take and that have been prescribed to you. Take any new Medicines after you have completely understood and accept all the possible  adverse reactions/side effects.   Please note  You were cared for by a hospitalist during your hospital stay. If you have any questions about your discharge medications or the care you received while you were in the hospital after you are discharged, you can call the unit and asked to speak with the hospitalist on call if the hospitalist that took care of you is not available. Once you are discharged, your primary care  physician will handle any further medical issues. Please note that NO REFILLS for any discharge medications will be authorized once you are discharged, as it is imperative that you return to your primary care physician (or establish a relationship with a primary care physician if you do not have one) for your aftercare needs so that they can reassess your need for medications and monitor your lab values.    Today   CHIEF COMPLAINT:   Chief Complaint  Patient presents with  . Numbness   77 y.o. female with a known history of brain aneurysm, hyperlipidemia, hypertension presented to the emergency room with tingling in the right side of the face and right hand numbness.  The following symptoms started a day ago and she went to the clinic to see her doctor who referred her to the emergency room.  Patient also felt her heart was racing and felt palpitations since last 24 hours.  She was evaluated in the emergency room was found to be in atrial fibrillation.  She was worked up with CT head which showed no acute abnormality.  Patient has claustrophobia and does not want a closed MRI scan.  She prefers open MRI scan.  No complaints of any seizures.  No headache, dizziness and blurry vision.  Hospitalist service was consulted.  Patient had a clipping of the brain aneurysm done 25 years ago.      VITAL SIGNS:  Blood pressure 123/72, pulse (!) 52, temperature 98 F (36.7 C), temperature source Oral, resp. rate 18, height 5\' 8"  (1.727 m), weight 86 kg (189 lb 8 oz), SpO2 96 %.  I/O:    Intake/Output Summary (Last 24 hours) at 11/27/2017 1008 Last data filed at 11/27/2017 0700 Gross per 24 hour  Intake 480 ml  Output 0 ml  Net 480 ml    PHYSICAL EXAMINATION:  GENERAL:  77 y.o.-year-old patient lying in the bed with no acute distress.  EYES: Pupils equal, round, reactive to light and accommodation. No scleral icterus. Extraocular muscles intact.  HEENT: Head atraumatic, normocephalic. Oropharynx  and nasopharynx clear.  NECK:  Supple, no jugular venous distention. No thyroid enlargement, no tenderness.  LUNGS: Normal breath sounds bilaterally, no wheezing, rales,rhonchi or crepitation. No use of accessory muscles of respiration.  CARDIOVASCULAR: S1, S2 normal. No murmurs, rubs, or gallops.  ABDOMEN: Soft, non-tender, non-distended. Bowel sounds present. No organomegaly or mass.  EXTREMITIES: No pedal edema, cyanosis, or clubbing.  NEUROLOGIC: Cranial nerves II through XII are intact. Muscle strength 5/5 in all extremities. Sensation intact. Gait not checked.  PSYCHIATRIC: The patient is alert and oriented x 3.  SKIN: No obvious rash, lesion, or ulcer.   DATA REVIEW:   CBC Recent Labs  Lab 11/25/17 1812  WBC 3.4*  HGB 14.3  HCT 43.0  PLT 268    Chemistries  Recent Labs  Lab 11/25/17 1812  NA 141  K 3.7  CL 107  CO2 28  GLUCOSE 93  BUN 17  CREATININE 0.96  CALCIUM 9.5  AST 22  ALT 16  ALKPHOS 54  BILITOT 0.4    Cardiac Enzymes Recent Labs  Lab 11/25/17 1812  TROPONINI <0.03    Microbiology Results  No results found for this or any previous visit.  RADIOLOGY:  Ct Head Wo Contrast  Result Date: 11/25/2017 CLINICAL DATA:  RIGHT-sided sensory symptoms which developed yesterday. Evaluate for stroke. EXAM: CT HEAD WITHOUT CONTRAST TECHNIQUE: Contiguous axial images were obtained from the base of the skull through the vertex without intravenous contrast. COMPARISON:  None. 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 An aneurysm clip is seen on the LEFT, PICA origin. Small silver clips are seen elsewhere in the cerebellum. 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 Overall volume of the cerebral hemispheres appears roughly normal for age. 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 There are no areas definite acute infarction, acute hemorrhage, mass lesion, hydrocephalus, or extra-axial fluid. The fourth ventricle is enlarged but does not appear trapped, rather the appearance is due to ex vacuo dilatation. Vascular: Calcification of the cavernous internal carotid arteries consistent with cerebrovascular atherosclerotic disease. No signs of intracranial large vessel occlusion. Skull: Postsurgical change.  No fracture. Sinuses/Orbits: No acute finding. Other: None. IMPRESSION: Previous craniotomy/craniectomy for LEFT PICA aneurysm. Postsurgical changes as described. No acute intracranial abnormality is detected. Electronically Signed   By: Elsie Stain M.D.   On: 11/25/2017 19:30   US Carotid Bilateral (at [facility] Only)  Result Date: 11/26/2017 CLINICAL DATA:  TIA. History of hypertension and hyperlipidemia. History of brain aneurysm with clipping approximately 25 years ago EXAM: BILATERAL CAROTID DUPLEX ULTRASOUND TECHNIQUE: Wallace Cullens scale imaging, color Doppler and duplex ultrasound were performed of bilateral carotid and vertebral arteries in the neck. COMPARISON:  None. FINDINGS: Criteria: Quantification of carotid stenosis is based on velocity parameters that correlate the residual internal carotid diameter with NASCET-based stenosis levels, using the diameter of the distal internal carotid lumen as the denominator for stenosis measurement. The following velocity measurements were obtained: RIGHT ICA:  52/12 cm/sec CCA:  73/16 cm/sec SYSTOLIC ICA/CCA RATIO:  0.7 DIASTOLIC ICA/CCA RATIO:  0.7 ECA:  83 cm/sec LEFT ICA:  55/12 cm/sec CCA:  73/18 cm/sec SYSTOLIC ICA/CCA RATIO:  0.8 DIASTOLIC ICA/CCA RATIO:  0.7 ECA:  60 cm/sec RIGHT CAROTID ARTERY: There is a minimal amount of atherosclerotic plaque involving the origin and proximal aspect the right internal carotid artery (image 22), not resulting in elevated peak systolic velocities  within the interrogated course the right internal carotid artery to suggest a hemodynamically significant stenosis. RIGHT VERTEBRAL ARTERY:  Antegrade Flow LEFT CAROTID ARTERY: There is a minimal amount of atherosclerotic plaque involving the distal aspect the left common carotid artery (image 42), not resulting in elevated peak systolic velocities within the interrogated course the left internal carotid artery to suggest a hemodynamically significant stenosis. LEFT VERTEBRAL ARTERY:  Antegrade flow Note is made of a cardiac arrhythmia. IMPRESSION: 1. Minimal amount of bilateral atherosclerotic plaque, not resulting in hemodynamically significant stenosis within either internal carotid artery. 2. Incidental note made of a cardiac arrhythmia. Further evaluation with ECG monitoring could be performed as indicated. Electronically Signed   By: Simonne Come M.D.   On: 11/26/2017 11:09    EKG:   Orders placed or performed during the hospital encounter of 11/26/17  . ED EKG  . ED EKG      Management plans discussed with  the patient, family and they are in agreement.  CODE STATUS:     Code Status Orders  (From admission, onward)        Start     Ordered   11/26/17 0226  Full code  Continuous     11/26/17 0225    Code Status History    Date Active Date Inactive Code Status Order ID Comments User Context   This patient has a current code status but no historical code status.    Advance Directive Documentation     Most Recent Value  Type of Advance Directive  Living will, Healthcare Power of Attorney  Pre-existing out of facility DNR order (yellow form or pink MOST form)  No data  "MOST" Form in Place?  No data      TOTAL TIME TAKING CARE OF THIS PATIENT: 45 minutes.    Evelena AsaMontell D Jazzlene Huot M.D on 11/27/2017 at 10:08 AM  Between 7am to 6pm - Pager - [phone]  After 6pm go to [email or web address] - password [email or web address]  [facility]  Office  [phone]  CC: Primary care  physician; Lucia EstelleZheng, Feng, NP   Note: This dictation was prepared with Dragon dictation along with smaller phrase technology. Any transcriptional errors that result from this process are unintentional.

## 2020-07-24 ENCOUNTER — Other Ambulatory Visit: Payer: Self-pay | Admitting: Otolaryngology

## 2020-07-24 DIAGNOSIS — R221 Localized swelling, mass and lump, neck: Secondary | ICD-10-CM

## 2020-07-26 ENCOUNTER — Other Ambulatory Visit: Payer: Self-pay | Admitting: Otolaryngology

## 2020-07-26 DIAGNOSIS — H9209 Otalgia, unspecified ear: Secondary | ICD-10-CM

## 2020-07-26 DIAGNOSIS — R221 Localized swelling, mass and lump, neck: Secondary | ICD-10-CM

## 2020-07-28 ENCOUNTER — Ambulatory Visit: Admission: RE | Admit: 2020-07-28 | Payer: Medicare HMO | Source: Ambulatory Visit

## 2020-07-28 ENCOUNTER — Ambulatory Visit: Payer: Medicare HMO

## 2021-02-02 ENCOUNTER — Other Ambulatory Visit: Payer: Self-pay

## 2021-02-02 ENCOUNTER — Encounter: Payer: Self-pay | Admitting: Ophthalmology

## 2021-02-04 NOTE — Discharge Instructions (Signed)

## 2021-02-06 ENCOUNTER — Other Ambulatory Visit: Payer: Self-pay

## 2021-02-06 ENCOUNTER — Ambulatory Visit
Admission: RE | Admit: 2021-02-06 | Discharge: 2021-02-06 | Disposition: A | Discharge status: Home / Self Care | Payer: Medicare HMO | Source: Ambulatory Visit | Attending: Ophthalmology | Admitting: Ophthalmology

## 2021-02-06 ENCOUNTER — Encounter
Admission: RE | Disposition: A | Discharge status: Home / Self Care | Payer: Self-pay | Source: Ambulatory Visit | Attending: Ophthalmology

## 2021-02-06 ENCOUNTER — Ambulatory Visit: Payer: Medicare HMO | Admitting: Anesthesiology

## 2021-02-06 DIAGNOSIS — Z79899 Other long term (current) drug therapy: Secondary | ICD-10-CM | POA: Insufficient documentation

## 2021-02-06 DIAGNOSIS — Z888 Allergy status to other drugs, medicaments and biological substances status: Secondary | ICD-10-CM | POA: Diagnosis not present

## 2021-02-06 DIAGNOSIS — Z7901 Long term (current) use of anticoagulants: Secondary | ICD-10-CM | POA: Insufficient documentation

## 2021-02-06 DIAGNOSIS — Z87891 Personal history of nicotine dependence: Secondary | ICD-10-CM | POA: Diagnosis not present

## 2021-02-06 DIAGNOSIS — H2511 Age-related nuclear cataract, right eye: Secondary | ICD-10-CM | POA: Diagnosis present

## 2021-02-06 HISTORY — DX: Presence of dental prosthetic device (complete) (partial): Z97.2

## 2021-02-06 HISTORY — PX: CATARACT EXTRACTION W/PHACO: SHX586

## 2021-02-06 HISTORY — DX: Motion sickness, initial encounter: T75.3XXA

## 2021-02-06 SURGERY — PHACOEMULSIFICATION, CATARACT, WITH IOL INSERTION
Anesthesia: Monitor Anesthesia Care | Site: Eye | Laterality: Right

## 2021-02-06 MED ORDER — FENTANYL CITRATE (PF) 100 MCG/2ML IJ SOLN
INTRAMUSCULAR | Status: DC | PRN
  Administered 2021-02-06: 50 ug via INTRAVENOUS

## 2021-02-06 MED ORDER — BRIMONIDINE TARTRATE-TIMOLOL 0.2-0.5 % OP SOLN
OPHTHALMIC | Status: DC | PRN
  Administered 2021-02-06: 1 [drp] via OPHTHALMIC

## 2021-02-06 MED ORDER — ARMC OPHTHALMIC DILATING DROPS
1.0000 "application " | OPHTHALMIC | Status: DC | PRN
  Administered 2021-02-06 (×3): 1 via OPHTHALMIC

## 2021-02-06 MED ORDER — NA HYALUR & NA CHOND-NA HYALUR 0.4-0.35 ML IO KIT
PACK | INTRAOCULAR | Status: DC | PRN
  Administered 2021-02-06: 1 mL via INTRAOCULAR

## 2021-02-06 MED ORDER — LIDOCAINE HCL (PF) 2 % IJ SOLN
INTRAOCULAR | Status: DC | PRN
  Administered 2021-02-06: 1 mL

## 2021-02-06 MED ORDER — TETRACAINE HCL 0.5 % OP SOLN
1.0000 [drp] | OPHTHALMIC | Status: DC | PRN
  Administered 2021-02-06 (×3): 1 [drp] via OPHTHALMIC

## 2021-02-06 MED ORDER — CEFUROXIME OPHTHALMIC INJECTION 1 MG/0.1 ML
INJECTION | OPHTHALMIC | Status: DC | PRN
  Administered 2021-02-06: 0.1 mL via INTRACAMERAL

## 2021-02-06 MED ORDER — EPINEPHRINE PF 1 MG/ML IJ SOLN
INTRAOCULAR | Status: DC | PRN
  Administered 2021-02-06: 68 mL via OPHTHALMIC

## 2021-02-06 MED ORDER — MIDAZOLAM HCL 2 MG/2ML IJ SOLN
INTRAMUSCULAR | Status: DC | PRN
  Administered 2021-02-06: 1 mg via INTRAVENOUS

## 2021-02-06 SURGICAL SUPPLY — 18 items
CANNULA ANT/CHMB 27GA (MISCELLANEOUS) ×2 IMPLANT
GLOVE SURG TRIUMPH 8.0 PF LTX (GLOVE) ×2 IMPLANT
GOWN STRL REUS W/ TWL LRG LVL3 (GOWN DISPOSABLE) ×2 IMPLANT
GOWN STRL REUS W/TWL LRG LVL3 (GOWN DISPOSABLE) ×4
LENS IOL DIOP 18.5 (Intraocular Lens) ×2 IMPLANT
LENS IOL TECNIS MONO 18.5 (Intraocular Lens) ×1 IMPLANT
MARKER SKIN DUAL TIP RULER LAB (MISCELLANEOUS) ×2 IMPLANT
NEEDLE CAPSULORHEX 25GA (NEEDLE) ×2 IMPLANT
NEEDLE FILTER BLUNT 18X 1/2SAF (NEEDLE) ×2
NEEDLE FILTER BLUNT 18X1 1/2 (NEEDLE) ×2 IMPLANT
PACK CATARACT BRASINGTON (MISCELLANEOUS) ×2 IMPLANT
PACK EYE AFTER SURG (MISCELLANEOUS) ×2 IMPLANT
PACK OPTHALMIC (MISCELLANEOUS) ×2 IMPLANT
SOLUTION OPHTHALMIC SALT (MISCELLANEOUS) ×2 IMPLANT
SYR 3ML LL SCALE MARK (SYRINGE) ×4 IMPLANT
SYR TB 1ML LUER SLIP (SYRINGE) ×2 IMPLANT
WATER STERILE IRR 250ML POUR (IV SOLUTION) ×2 IMPLANT
WIPE NON LINTING 3.25X3.25 (MISCELLANEOUS) ×2 IMPLANT

## 2021-02-06 NOTE — Op Note (Signed)
  LOCATION:  [facility]   PREOPERATIVE DIAGNOSIS:    Nuclear sclerotic cataract right eye. H25.11   POSTOPERATIVE DIAGNOSIS:  Nuclear sclerotic cataract right eye.     PROCEDURE:  Phacoemusification with posterior chamber intraocular lens placement of the right eye   ULTRASOUND TIME: Procedure(s): CATARACT EXTRACTION PHACO AND INTRAOCULAR LENS PLACEMENT (IOC) RIGHT 5.43 01:08.1 8.0% (Right)  LENS:   Implant Name Type Inv. Item Serial No. Manufacturer Lot No. LRB No. Used Action  LENS IOL DIOP 18.5 - [id number] Intraocular Lens LENS IOL DIOP 18.5 [id number]   Right 1 Implanted         SURGEON:  Deirdre Evener, MD   ANESTHESIA:  Topical with tetracaine drops and 2% Xylocaine jelly, augmented with 1% preservative-free intracameral lidocaine.    COMPLICATIONS:  None.   DESCRIPTION OF PROCEDURE:  The patient was identified in the holding room and transported to the operating room and placed in the supine position under the operating microscope.  The right eye was identified as the operative eye and it was prepped and draped in the usual sterile ophthalmic fashion.   A 1 millimeter clear-corneal paracentesis was made at the 12:00 position.  0.5 ml of preservative-free 1% lidocaine was injected into the anterior chamber. The anterior chamber was filled with Viscoat viscoelastic.  A 2.4 millimeter keratome was used to make a near-clear corneal incision at the 9:00 position.  A curvilinear capsulorrhexis was made with a cystotome and capsulorrhexis forceps.  Balanced salt solution was used to hydrodissect and hydrodelineate the nucleus.   Phacoemulsification was then used in stop and chop fashion to remove the lens nucleus and epinucleus.  The remaining cortex was then removed using the irrigation and aspiration handpiece. Provisc was then placed into the capsular bag to distend it for lens placement.  A lens was then injected into the capsular bag.  The remaining  viscoelastic was aspirated.   Wounds were hydrated with balanced salt solution.  The anterior chamber was inflated to a physiologic pressure with balanced salt solution.  No wound leaks were noted. Cefuroxime 0.1 ml of a 10mg /ml solution was injected into the anterior chamber for a dose of 1 mg of intracameral antibiotic at the completion of the case.   Timolol and Brimonidine drops were applied to the eye.  The patient was taken to the recovery room in stable condition without complications of anesthesia or surgery.   Kona Yusuf 02/06/2021, 1:58 PM

## 2021-02-06 NOTE — Anesthesia Preprocedure Evaluation (Signed)
Anesthesia Evaluation  Patient identified by MRN, date of birth, ID band Patient awake    History of Anesthesia Complications Negative for: history of anesthetic complications  Airway Mallampati: II  TM Distance: >3 FB Neck ROM: Full    Dental  (+) Upper Dentures   Pulmonary former smoker,    Pulmonary exam normal        Cardiovascular Exercise Tolerance: Good hypertension, Pt. on medications Normal cardiovascular exam+ dysrhythmias (chronic AF, on Eliquis) Atrial Fibrillation      Neuro/Psych TIA   GI/Hepatic negative GI ROS, Neg liver ROS,   Endo/Other  negative endocrine ROS  Renal/GU negative Renal ROS     Musculoskeletal   Abdominal   Peds  Hematology negative hematology ROS (+)   Anesthesia Other Findings   Reproductive/Obstetrics                           Anesthesia Physical Anesthesia Plan  ASA: III  Anesthesia Plan: MAC   Post-op Pain Management:    Induction:   PONV Risk Score and Plan: 2 and TIVA, Midazolam and Treatment may vary due to age or medical condition  Airway Management Planned: Nasal Cannula and Natural Airway  Additional Equipment: None  Intra-op Plan:   Post-operative Plan:   Informed Consent: I have reviewed the patients History and Physical, chart, labs and discussed the procedure including the risks, benefits and alternatives for the proposed anesthesia with the patient or authorized representative who has indicated his/her understanding and acceptance.       Plan Discussed with: CRNA  Anesthesia Plan Comments:         Anesthesia Quick Evaluation

## 2021-02-06 NOTE — H&P (Signed)
[facility]   Primary Care Physician:  Erasmo Downer, NP Ophthalmologist: Dr. Lockie Mola  Pre-Procedure History & Physical: HPI:  Kayla Vaughn is a 80 y.o. female here for ophthalmic surgery.   Past Medical History:  Diagnosis Date  . Brain aneurysm 1996  . Hyperlipemia   . Hypertension   . Motion sickness    boats, long car rides  . Wears dentures    full upper    Past Surgical History:  Procedure Laterality Date  . ABDOMINAL HYSTERECTOMY    . BACK SURGERY    . BRAIN SURGERY  1996  . CHOLECYSTECTOMY      Prior to Admission medications   Medication Sig Start Date End Date Taking? Authorizing Provider  ALPRAZolam (XANAX) 0.25 MG tablet Take 0.25 mg by mouth. Prn when traveling for panic attack per pt   Yes [provider]  amLODipine (NORVASC) 10 MG tablet Take 10 mg by mouth daily.   Yes [provider]  apixaban (ELIQUIS) 5 MG TABS tablet Take 1 tablet (5 mg total) by mouth 2 (two) times daily. 11/27/17  Yes Salary, Evelena Asa, MD  Brimonidine Tartrate-Timolol (COMBIGAN OP) Apply to eye in the morning and at bedtime.   Yes [provider]  cholecalciferol (VITAMIN D) 1000 units tablet Take 5,000 Units by mouth daily.   Yes [provider]  fluticasone (FLONASE) 50 MCG/ACT nasal spray Place 1 spray into both nostrils daily as needed for allergies or rhinitis.   Yes [provider]  ibuprofen (ADVIL,MOTRIN) 800 MG tablet Take 800 mg by mouth every 8 (eight) hours as needed.   Yes [provider]  latanoprost (XALATAN) 0.005 % ophthalmic solution 1 drop at bedtime.   Yes [provider]  lisinopril (PRINIVIL,ZESTRIL) 20 MG tablet Take 20 mg by mouth daily.   Yes [provider]  Multiple Vitamins-Minerals (WOMENS 50+ MULTI VITAMIN/MIN PO) Take by mouth.   Yes [provider]  omega-3 acid ethyl esters (LOVAZA) 1 g capsule Take 1 g by mouth daily.   Yes [provider]     Allergies as of 12/25/2020 - Review Complete 11/26/2017  Allergen Reaction Noted  . Elemental sulfur  11/26/2017    Family History  Problem Relation Age of Onset  . Breast cancer Maternal Grandmother     Social History   Socioeconomic History  . Marital status: Legally Separated    Spouse name: Not on file  . Number of children: Not on file  . Years of education: Not on file  . Highest education level: Not on file  Occupational History  . Occupation: retired  Tobacco Use  . Smoking status: Former Smoker    Types: Cigarettes    Quit date: 1994    Years since quitting: 28.3  . Smokeless tobacco: Never Used  Vaping Use  . Vaping Use: Never used  Substance and Sexual Activity  . Alcohol use: No  . Drug use: No  . Sexual activity: Not on file  Other Topics Concern  . Not on file  Social History Narrative  . Not on file   Social Determinants of Health   Financial Resource Strain: Not on file  Food Insecurity: Not on file  Transportation Needs: Not on file  Physical Activity: Not on file  Stress: Not on file  Social Connections: Not on file  Intimate Partner Violence: Not on file    Review of Systems: See HPI, otherwise negative ROS  Physical Exam: BP Marland Kitchen)  165/99   Pulse 70   Temp 98 F (36.7 C) (Temporal)   Resp 20   Ht 5\' 4"  (1.626 m)   Wt 85.9 kg   SpO2 98%   BMI 32.51 kg/m  General:   Alert,  pleasant and cooperative in NAD Head:  Normocephalic and atraumatic. Lungs:  Clear to auscultation.    Heart:  Regular rate and rhythm.   Impression/Plan: is here for ophthalmic surgery.  Risks, benefits, limitations, and alternatives regarding ophthalmic surgery have been reviewed with the patient.  Questions have been answered.  All parties agreeable.   [name], MD  02/06/2021, 12:46 PM

## 2021-02-06 NOTE — Anesthesia Postprocedure Evaluation (Signed)
Anesthesia Post Note  Patient: Kayla Vaughn  Procedure(s) Performed: CATARACT EXTRACTION PHACO AND INTRAOCULAR LENS PLACEMENT (IOC) RIGHT 5.43 01:08.1 8.0% (Right Eye)     Patient location during evaluation: PACU Anesthesia Type: MAC Level of consciousness: awake and alert Pain management: pain level controlled Vital Signs Assessment: post-procedure vital signs reviewed and stable Respiratory status: spontaneous breathing Cardiovascular status: blood pressure returned to baseline Postop Assessment: no apparent nausea or vomiting, adequate PO intake and no headache Anesthetic complications: no   No complications documented.  Adele Barthel Bobbette Eakes

## 2021-02-06 NOTE — Transfer of Care (Signed)
Immediate Anesthesia Transfer of Care Note  Patient: Kayla Vaughn  Procedure(s) Performed: CATARACT EXTRACTION PHACO AND INTRAOCULAR LENS PLACEMENT (IOC) RIGHT 5.43 01:08.1 8.0% (Right Eye)  Patient Location: PACU  Anesthesia Type: MAC  Level of Consciousness: awake, alert  and patient cooperative  Airway and Oxygen Therapy: Patient Spontanous Breathing and Patient connected to supplemental oxygen  Post-op Assessment: Post-op Vital signs reviewed, Patient's Cardiovascular Status Stable, Respiratory Function Stable, Patent Airway and No signs of Nausea or vomiting  Post-op Vital Signs: Reviewed and stable  Complications: No complications documented.

## 2021-02-09 ENCOUNTER — Encounter: Payer: Self-pay | Admitting: Ophthalmology

## 2021-02-10 ENCOUNTER — Other Ambulatory Visit: Payer: Self-pay

## 2021-02-10 ENCOUNTER — Encounter: Payer: Self-pay | Admitting: Ophthalmology

## 2021-02-16 NOTE — Discharge Instructions (Signed)

## 2021-02-18 ENCOUNTER — Ambulatory Visit: Payer: Medicare HMO | Admitting: Anesthesiology

## 2021-02-18 ENCOUNTER — Ambulatory Visit
Admission: RE | Admit: 2021-02-18 | Discharge: 2021-02-18 | Disposition: A | Discharge status: Home / Self Care | Payer: Medicare HMO | Source: Ambulatory Visit | Attending: Ophthalmology | Admitting: Ophthalmology

## 2021-02-18 ENCOUNTER — Encounter
Admission: RE | Disposition: A | Discharge status: Home / Self Care | Payer: Self-pay | Source: Ambulatory Visit | Attending: Ophthalmology

## 2021-02-18 ENCOUNTER — Encounter: Payer: Self-pay | Admitting: Ophthalmology

## 2021-02-18 ENCOUNTER — Other Ambulatory Visit: Payer: Self-pay

## 2021-02-18 DIAGNOSIS — Z79899 Other long term (current) drug therapy: Secondary | ICD-10-CM | POA: Insufficient documentation

## 2021-02-18 DIAGNOSIS — H2512 Age-related nuclear cataract, left eye: Secondary | ICD-10-CM | POA: Insufficient documentation

## 2021-02-18 DIAGNOSIS — Z87891 Personal history of nicotine dependence: Secondary | ICD-10-CM | POA: Diagnosis not present

## 2021-02-18 DIAGNOSIS — Z9841 Cataract extraction status, right eye: Secondary | ICD-10-CM | POA: Insufficient documentation

## 2021-02-18 DIAGNOSIS — Z7901 Long term (current) use of anticoagulants: Secondary | ICD-10-CM | POA: Diagnosis not present

## 2021-02-18 DIAGNOSIS — Z961 Presence of intraocular lens: Secondary | ICD-10-CM | POA: Insufficient documentation

## 2021-02-18 DIAGNOSIS — Z888 Allergy status to other drugs, medicaments and biological substances status: Secondary | ICD-10-CM | POA: Diagnosis not present

## 2021-02-18 HISTORY — PX: CATARACT EXTRACTION W/PHACO: SHX586

## 2021-02-18 SURGERY — PHACOEMULSIFICATION, CATARACT, WITH IOL INSERTION
Anesthesia: Monitor Anesthesia Care | Site: Eye | Laterality: Left

## 2021-02-18 MED ORDER — BRIMONIDINE TARTRATE-TIMOLOL 0.2-0.5 % OP SOLN
OPHTHALMIC | Status: DC | PRN
  Administered 2021-02-18: 1 [drp] via OPHTHALMIC

## 2021-02-18 MED ORDER — CEFUROXIME OPHTHALMIC INJECTION 1 MG/0.1 ML
INJECTION | OPHTHALMIC | Status: DC | PRN
  Administered 2021-02-18: 0.1 mL via INTRACAMERAL

## 2021-02-18 MED ORDER — NA HYALUR & NA CHOND-NA HYALUR 0.4-0.35 ML IO KIT
PACK | INTRAOCULAR | Status: DC | PRN
  Administered 2021-02-18: 1 mL via INTRAOCULAR

## 2021-02-18 MED ORDER — ARMC OPHTHALMIC DILATING DROPS
1.0000 "application " | OPHTHALMIC | Status: DC | PRN
  Administered 2021-02-18 (×3): 1 via OPHTHALMIC

## 2021-02-18 MED ORDER — LIDOCAINE HCL (PF) 2 % IJ SOLN
INTRAOCULAR | Status: DC | PRN
  Administered 2021-02-18: 2 mL

## 2021-02-18 MED ORDER — MIDAZOLAM HCL 2 MG/2ML IJ SOLN
INTRAMUSCULAR | Status: DC | PRN
  Administered 2021-02-18: 2 mg via INTRAVENOUS

## 2021-02-18 MED ORDER — OXYCODONE HCL 5 MG PO TABS: 5.0000 mg | ORAL_TABLET | Freq: Once | ORAL | Status: DC | PRN

## 2021-02-18 MED ORDER — LACTATED RINGERS IV SOLN: INTRAVENOUS | Status: DC

## 2021-02-18 MED ORDER — OXYCODONE HCL 5 MG/5ML PO SOLN: 5.0000 mg | Freq: Once | ORAL | Status: DC | PRN

## 2021-02-18 MED ORDER — TETRACAINE HCL 0.5 % OP SOLN
1.0000 [drp] | OPHTHALMIC | Status: DC | PRN
  Administered 2021-02-18 (×3): 1 [drp] via OPHTHALMIC

## 2021-02-18 MED ORDER — FENTANYL CITRATE (PF) 100 MCG/2ML IJ SOLN
INTRAMUSCULAR | Status: DC | PRN
  Administered 2021-02-18: 50 ug via INTRAVENOUS

## 2021-02-18 MED ORDER — EPINEPHRINE PF 1 MG/ML IJ SOLN
INTRAOCULAR | Status: DC | PRN
  Administered 2021-02-18: 70 mL via OPHTHALMIC

## 2021-02-18 SURGICAL SUPPLY — 19 items
CANNULA ANT/CHMB 27GA (MISCELLANEOUS) ×2 IMPLANT
GLOVE SURG ENC TEXT LTX SZ7.5 (GLOVE) ×2 IMPLANT
GLOVE SURG TRIUMPH 8.0 PF LTX (GLOVE) ×2 IMPLANT
GOWN STRL REUS W/ TWL LRG LVL3 (GOWN DISPOSABLE) ×2 IMPLANT
GOWN STRL REUS W/TWL LRG LVL3 (GOWN DISPOSABLE) ×4
LENS IOL DIOP 19.0 (Intraocular Lens) ×2 IMPLANT
LENS IOL TECNIS MONO 19.0 (Intraocular Lens) ×1 IMPLANT
MARKER SKIN DUAL TIP RULER LAB (MISCELLANEOUS) ×2 IMPLANT
NEEDLE CAPSULORHEX 25GA (NEEDLE) ×2 IMPLANT
NEEDLE FILTER BLUNT 18X 1/2SAF (NEEDLE) ×2
NEEDLE FILTER BLUNT 18X1 1/2 (NEEDLE) ×2 IMPLANT
PACK CATARACT BRASINGTON (MISCELLANEOUS) ×2 IMPLANT
PACK EYE AFTER SURG (MISCELLANEOUS) ×2 IMPLANT
PACK OPTHALMIC (MISCELLANEOUS) ×2 IMPLANT
SOLUTION OPHTHALMIC SALT (MISCELLANEOUS) ×2 IMPLANT
SYR 3ML LL SCALE MARK (SYRINGE) ×4 IMPLANT
SYR TB 1ML LUER SLIP (SYRINGE) ×2 IMPLANT
WATER STERILE IRR 250ML POUR (IV SOLUTION) ×2 IMPLANT
WIPE NON LINTING 3.25X3.25 (MISCELLANEOUS) ×2 IMPLANT

## 2021-02-18 NOTE — H&P (Signed)
[facility]   Primary Care Physician:  Erasmo Downer, NP Ophthalmologist: Dr. Lockie Mola  Pre-Procedure History & Physical: HPI:  Kayla Vaughn is a 80 y.o. female here for ophthalmic surgery.   Past Medical History:  Diagnosis Date  . Brain aneurysm 1996  . Hyperlipemia   . Hypertension   . Motion sickness    boats, long car rides  . Wears dentures    full upper    Past Surgical History:  Procedure Laterality Date  . ABDOMINAL HYSTERECTOMY    . BACK SURGERY    . BRAIN SURGERY  1996  . CATARACT EXTRACTION W/PHACO Right 02/06/2021   Procedure: CATARACT EXTRACTION PHACO AND INTRAOCULAR LENS PLACEMENT (IOC) RIGHT 5.43 01:08.1 8.0%;  Surgeon: Lockie Mola, MD;  Location: [facility];  Service: Ophthalmology;  Laterality: Right;  . CHOLECYSTECTOMY      Prior to Admission medications   Medication Sig Start Date End Date Taking? Authorizing Provider  ALPRAZolam (XANAX) 0.25 MG tablet Take 0.25 mg by mouth. Prn when traveling for panic attack per pt   Yes [provider]  amLODipine (NORVASC) 10 MG tablet Take 10 mg by mouth daily.   Yes [provider]  apixaban (ELIQUIS) 5 MG TABS tablet Take 1 tablet (5 mg total) by mouth 2 (two) times daily. 11/27/17  Yes Salary, Evelena Asa, MD  Brimonidine Tartrate-Timolol (COMBIGAN OP) Apply to eye in the morning and at bedtime.   Yes [provider]  cholecalciferol (VITAMIN D) 1000 units tablet Take 5,000 Units by mouth daily.   Yes [provider]  fluticasone (FLONASE) 50 MCG/ACT nasal spray Place 1 spray into both nostrils daily as needed for allergies or rhinitis.   Yes [provider]  ibuprofen (ADVIL,MOTRIN) 800 MG tablet Take 800 mg by mouth every 8 (eight) hours as needed.   Yes [provider]  latanoprost (XALATAN) 0.005 % ophthalmic solution 1 drop at bedtime.   Yes [provider]  lisinopril (PRINIVIL,ZESTRIL) 20 MG tablet Take 20  mg by mouth daily.   Yes [provider]  Multiple Vitamins-Minerals (WOMENS 50+ MULTI VITAMIN/MIN PO) Take by mouth.   Yes [provider]  omega-3 acid ethyl esters (LOVAZA) 1 g capsule Take 1 g by mouth daily.   Yes [provider]    Allergies as of 12/25/2020 - Review Complete 11/26/2017  Allergen Reaction Noted  . Elemental sulfur  11/26/2017    Family History  Problem Relation Age of Onset  . Breast cancer Maternal Grandmother     Social History   Socioeconomic History  . Marital status: Legally Separated    Spouse name: Not on file  . Number of children: Not on file  . Years of education: Not on file  . Highest education level: Not on file  Occupational History  . Occupation: retired  Tobacco Use  . Smoking status: Former Smoker    Types: Cigarettes    Quit date: 1994    Years since quitting: 28.3  . Smokeless tobacco: Never Used  Vaping Use  . Vaping Use: Never used  Substance and Sexual Activity  . Alcohol use: No  . Drug use: No  . Sexual activity: Not on file  Other Topics Concern  . Not on file  Social History Narrative  . Not on file   Social Determinants of Health   Financial Resource Strain: Not on file  Food Insecurity: Not on file  Transportation Needs: Not on file  Physical  Activity: Not on file  Stress: Not on file  Social Connections: Not on file  Intimate Partner Violence: Not on file    Review of Systems: See HPI, otherwise negative ROS  Physical Exam: BP (!) 150/95   Pulse 66   Temp 97.9 F (36.6 C) (Temporal)   Ht 5\' 4"  (1.626 m)   Wt 84.4 kg   SpO2 98%   BMI 31.93 kg/m  General:   Alert,  pleasant and cooperative in NAD Head:  Normocephalic and atraumatic. Lungs:  Clear to auscultation.    Heart:  Regular rate and rhythm.   Impression/Plan: is here for ophthalmic surgery.  Risks, benefits, limitations, and alternatives regarding ophthalmic surgery have been reviewed with the  patient.  Questions have been answered.  All parties agreeable.   [name], MD  02/18/2021, 9:41 AM

## 2021-02-18 NOTE — Anesthesia Postprocedure Evaluation (Signed)
Anesthesia Post Note  Patient: Kayla Vaughn  Procedure(s) Performed: CATARACT EXTRACTION PHACO AND INTRAOCULAR LENS PLACEMENT (IOC) LEFT 8.25 01:13.2 11.2% (Left Eye)     Patient location during evaluation: PACU Anesthesia Type: MAC Level of consciousness: awake and alert Pain management: pain level controlled Vital Signs Assessment: post-procedure vital signs reviewed and stable Respiratory status: spontaneous breathing, nonlabored ventilation, respiratory function stable and patient connected to nasal cannula oxygen Cardiovascular status: stable and blood pressure returned to baseline Postop Assessment: no apparent nausea or vomiting Anesthetic complications: no   No complications documented.  Fidel Levy

## 2021-02-18 NOTE — Op Note (Signed)
  OPERATIVE NOTE  Kayla Vaughn [id number] 02/18/2021   PREOPERATIVE DIAGNOSIS:  Nuclear sclerotic cataract left eye. H25.12   POSTOPERATIVE DIAGNOSIS:    Nuclear sclerotic cataract left eye.     PROCEDURE:  Phacoemusification with posterior chamber intraocular lens placement of the left eye  Ultrasound time: Procedure(s): CATARACT EXTRACTION PHACO AND INTRAOCULAR LENS PLACEMENT (IOC) LEFT 8.25 01:13.2 11.2% (Left)  LENS:   Implant Name Type Inv. Item Serial No. Manufacturer Lot No. LRB No. Used Action  LENS IOL DIOP 19.0 - [id number] Intraocular Lens LENS IOL DIOP 19.0 [id number] JOHNSON   Left 1 Implanted      SURGEON:  Deirdre Evener, MD   ANESTHESIA:  Topical with tetracaine drops and 2% Xylocaine jelly, augmented with 1% preservative-free intracameral lidocaine.    COMPLICATIONS:  None.   DESCRIPTION OF PROCEDURE:  The patient was identified in the holding room and transported to the operating room and placed in the supine position under the operating microscope.  The left eye was identified as the operative eye and it was prepped and draped in the usual sterile ophthalmic fashion.   A 1 millimeter clear-corneal paracentesis was made at the 1:30 position.  0.5 ml of preservative-free 1% lidocaine was injected into the anterior chamber.  The anterior chamber was filled with Viscoat viscoelastic.  A 2.4 millimeter keratome was used to make a near-clear corneal incision at the 10:30 position.  .  A curvilinear capsulorrhexis was made with a cystotome and capsulorrhexis forceps.  Balanced salt solution was used to hydrodissect and hydrodelineate the nucleus.   Phacoemulsification was then used in stop and chop fashion to remove the lens nucleus and epinucleus.  The remaining cortex was then removed using the irrigation and aspiration handpiece. Provisc was then placed into the capsular bag to distend it for lens placement.  A lens was then injected into the capsular bag.   The remaining viscoelastic was aspirated.   Wounds were hydrated with balanced salt solution.  The anterior chamber was inflated to a physiologic pressure with balanced salt solution.  No wound leaks were noted. Cefuroxime 0.1 ml of a 10mg /ml solution was injected into the anterior chamber for a dose of 1 mg of intracameral antibiotic at the completion of the case.   Timolol and Brimonidine drops were applied to the eye.  The patient was taken to the recovery room in stable condition without complications of anesthesia or surgery.  Bret Vanessen 02/18/2021, 10:24 AM

## 2021-02-18 NOTE — Transfer of Care (Signed)
Immediate Anesthesia Transfer of Care Note  Patient: Kayla Vaughn  Procedure(s) Performed: CATARACT EXTRACTION PHACO AND INTRAOCULAR LENS PLACEMENT (IOC) LEFT 8.25 01:13.2 11.2% (Left Eye)  Patient Location: PACU  Anesthesia Type: MAC  Level of Consciousness: awake, alert  and patient cooperative  Airway and Oxygen Therapy: Patient Spontanous Breathing and Patient connected to supplemental oxygen  Post-op Assessment: Post-op Vital signs reviewed, Patient's Cardiovascular Status Stable, Respiratory Function Stable, Patent Airway and No signs of Nausea or vomiting  Post-op Vital Signs: Reviewed and stable  Complications: No complications documented.

## 2021-02-18 NOTE — Anesthesia Preprocedure Evaluation (Addendum)
Anesthesia Evaluation  Patient identified by MRN, date of birth, ID band Patient awake    History of Anesthesia Complications Negative for: history of anesthetic complications  Airway Mallampati: II  TM Distance: >3 FB Neck ROM: Full    Dental  (+) Upper Dentures   Pulmonary neg pulmonary ROS, former smoker,    Pulmonary exam normal        Cardiovascular Exercise Tolerance: Good hypertension, Pt. on medications Normal cardiovascular exam+ dysrhythmias (chronic AF, on Eliquis) Atrial Fibrillation   ekg: 3/22: afib; lafb;  Echo with normal EF (see below) 60-65%;  cards: 3/22: beaty;    Neuro/Psych Anxiety Cerebral aneurysm 1996;  glaucoma    GI/Hepatic negative GI ROS, Neg liver ROS,   Endo/Other  Morbid obesity (bmi 32)  Renal/GU negative Renal ROS  negative genitourinary   Musculoskeletal   Abdominal   Peds  Hematology negative hematology ROS (+)   Anesthesia Other Findings  last eliquis: 4/27;  Had ECCE 2 weeks ago.  Reproductive/Obstetrics                            Anesthesia Physical  Anesthesia Plan  ASA: III  Anesthesia Plan: MAC   Post-op Pain Management:    Induction:   PONV Risk Score and Plan: 2 and TIVA and Midazolam  Airway Management Planned: Nasal Cannula and Natural Airway  Additional Equipment: None  Intra-op Plan:   Post-operative Plan:   Informed Consent: I have reviewed the patients History and Physical, chart, labs and discussed the procedure including the risks, benefits and alternatives for the proposed anesthesia with the patient or authorized representative who has indicated his/her understanding and acceptance.       Plan Discussed with: CRNA  Anesthesia Plan Comments:         Anesthesia Quick Evaluation

## 2021-02-18 NOTE — Anesthesia Procedure Notes (Signed)
Procedure Name: MAC Date/Time: 02/18/2021 10:12 AM Performed by: Jeannene Patella, CRNA Pre-anesthesia Checklist: Patient identified, Emergency Drugs available, Suction available, Timeout performed and Patient being monitored Patient Re-evaluated:Patient Re-evaluated prior to induction Oxygen Delivery Method: Nasal cannula Placement Confirmation: positive ETCO2

## 2021-02-19 ENCOUNTER — Encounter: Payer: Self-pay | Admitting: Ophthalmology

## 2021-05-26 ENCOUNTER — Encounter: Payer: Self-pay | Admitting: Orthopaedic Surgery

## 2021-05-26 ENCOUNTER — Other Ambulatory Visit: Payer: Self-pay

## 2021-05-26 ENCOUNTER — Ambulatory Visit: Payer: Medicare HMO

## 2021-05-26 ENCOUNTER — Ambulatory Visit: Payer: Medicare HMO | Admitting: Orthopaedic Surgery

## 2021-05-26 VITALS — BP 131/82 | HR 85 | Ht 67.0 in | Wt 189.4 lb

## 2021-05-26 DIAGNOSIS — G8929 Other chronic pain: Secondary | ICD-10-CM | POA: Diagnosis not present

## 2021-05-26 DIAGNOSIS — M25562 Pain in left knee: Secondary | ICD-10-CM | POA: Diagnosis not present

## 2021-05-26 DIAGNOSIS — M1712 Unilateral primary osteoarthritis, left knee: Secondary | ICD-10-CM

## 2021-05-26 MED ORDER — HYDROCODONE-ACETAMINOPHEN 5-325 MG PO TABS: ORAL_TABLET | ORAL | 0 refills | Status: DC

## 2021-05-26 NOTE — Progress Notes (Signed)
Subjective:    Patient ID: Kayla Vaughn, female    DOB: 1940/11/04, 80 y.o.   MRN: [id number]  HPI She has had increased pain in the left knee over the last two to three weeks.  She has had pain before and some swelling at times but not like now.  She has giving way of the knee at times.  She has a decided limp on the left.  She has no trauma.  She has tried ice, heat, rubs and Tylenol. She cannot take NSAIDs.  She is concerned as she is getting worse.   Review of Systems  Constitutional:  Positive for activity change.  Musculoskeletal:  Positive for arthralgias, gait problem and joint swelling.  All other systems reviewed and are negative. For Review of Systems, all other systems reviewed and are negative.  The following is a summary of the past history medically, past history surgically, known current medicines, social history and family history.  This information is gathered electronically by the computer from prior information and documentation.  I review this each visit and have found including this information at this point in the chart is beneficial and informative.   Past Medical History:  Diagnosis Date   Brain aneurysm 1996   Hyperlipemia    Hypertension    Motion sickness    boats, long car rides   Wears dentures    full upper    Past Surgical History:  Procedure Laterality Date   ABDOMINAL HYSTERECTOMY     BACK SURGERY     BRAIN SURGERY  1996   CATARACT EXTRACTION W/PHACO Right 02/06/2021   Procedure: CATARACT EXTRACTION PHACO AND INTRAOCULAR LENS PLACEMENT (IOC) RIGHT 5.43 01:08.1 8.0%;  Surgeon: Lockie Mola, MD;  Location: [facility];  Service: Ophthalmology;  Laterality: Right;   CATARACT EXTRACTION W/PHACO Left 02/18/2021   Procedure: CATARACT EXTRACTION PHACO AND INTRAOCULAR LENS PLACEMENT (IOC) LEFT 8.25 01:13.2 11.2%;  Surgeon: Lockie Mola, MD;  Location: [facility];  Service: Ophthalmology;  Laterality: Left;    CHOLECYSTECTOMY      Current Outpatient Medications on File Prior to Visit  Medication Sig Dispense Refill   ALPRAZolam (XANAX) 0.25 MG tablet Take 0.25 mg by mouth. Prn when traveling for panic attack per pt     amLODipine (NORVASC) 10 MG tablet Take 10 mg by mouth daily.     apixaban (ELIQUIS) 5 MG TABS tablet Take 1 tablet (5 mg total) by mouth 2 (two) times daily. 60 tablet 0   Brimonidine Tartrate-Timolol (COMBIGAN OP) Apply to eye in the morning and at bedtime.     cholecalciferol (VITAMIN D) 1000 units tablet Take 5,000 Units by mouth daily.     fluticasone (FLONASE) 50 MCG/ACT nasal spray Place 1 spray into both nostrils daily as needed for allergies or rhinitis.     latanoprost (XALATAN) 0.005 % ophthalmic solution 1 drop at bedtime.     lisinopril (PRINIVIL,ZESTRIL) 20 MG tablet Take 20 mg by mouth daily.     Multiple Vitamins-Minerals (WOMENS 50+ MULTI VITAMIN/MIN PO) Take by mouth.     omega-3 acid ethyl esters (LOVAZA) 1 g capsule Take 1 g by mouth daily.     No current facility-administered medications on file prior to visit.    Social History   Socioeconomic History   Marital status: Legally Separated    Spouse name: Not on file   Number of children: Not on file   Years of education: Not on file   Highest education level:  Not on file  Occupational History   Occupation: retired  Tobacco Use   Smoking status: Former    Types: Cigarettes    Quit date: 1994    Years since quitting: 28.6   Smokeless tobacco: Never  Vaping Use   Vaping Use: Never used  Substance and Sexual Activity   Alcohol use: No   Drug use: No   Sexual activity: Not on file  Other Topics Concern   Not on file  Social History Narrative   Not on file   Social Determinants of Health   Financial Resource Strain: Not on file  Food Insecurity: Not on file  Transportation Needs: Not on file  Physical Activity: Not on file  Stress: Not on file  Social Connections: Not on file  Intimate  Partner Violence: Not on file    Family History  Problem Relation Age of Onset   Breast cancer Maternal Grandmother     BP 131/82   Pulse 85   Ht 5\' 7"  (1.702 m)   Wt 189 lb 6.4 oz (85.9 kg)   BMI 29.66 kg/m   Body mass index is 29.66 kg/m.     Objective:   Physical Exam Vitals and nursing note reviewed. Exam conducted with a chaperone present.  Constitutional:      Appearance: She is well-developed.  HENT:     Head: Normocephalic and atraumatic.  Eyes:     Conjunctiva/sclera: Conjunctivae normal.     Pupils: Pupils are equal, round, and reactive to light.  Cardiovascular:     Rate and Rhythm: Normal rate and regular rhythm.  Pulmonary:     Effort: Pulmonary effort is normal.  Abdominal:     Palpations: Abdomen is soft.  Musculoskeletal:     Cervical back: Normal range of motion and neck supple.       Legs:  Skin:    General: Skin is warm and dry.  Neurological:     Mental Status: She is alert and oriented to person, place, and time.     Cranial Nerves: No cranial nerve deficit.     Motor: No abnormal muscle tone.     Coordination: Coordination normal.     Deep Tendon Reflexes: Reflexes are normal and symmetric. Reflexes normal.  Psychiatric:        Behavior: Behavior normal.        Thought Content: Thought content normal.        Judgment: Judgment normal.  X-rays were done of the left knee, reported separately.        Assessment & Plan:   Encounter Diagnosis  Name Primary?   Chronic pain of left knee Yes   I am concerned about medial meniscus tear.  She may need MRI.  PROCEDURE NOTE:  The patient requests injections of the left knee , verbal consent was obtained.  The left knee was prepped appropriately after time out was performed.   Sterile technique was observed and injection of 1 cc of Celestone 6 mg with several cc's of plain xylocaine. Anesthesia was provided by ethyl chloride and a 20-gauge needle was used to inject the knee area. The  injection was tolerated well.  A band aid dressing was applied.  The patient was advised to apply ice later today and tomorrow to the injection sight as needed.   Return in two weeks.  Do MRI if not improved. Call if any problem.  Precautions discussed.  Electronically Signed , MD 8/2/202210:48 AM

## 2021-06-09 ENCOUNTER — Ambulatory Visit: Payer: Medicare HMO | Admitting: Orthopaedic Surgery

## 2021-09-10 ENCOUNTER — Emergency Department: Payer: Medicare HMO

## 2021-09-10 ENCOUNTER — Emergency Department
Admission: EM | Admit: 2021-09-10 | Discharge: 2021-09-10 | Disposition: A | Discharge status: Home / Self Care | Payer: Medicare HMO | Attending: Emergency Medicine | Admitting: Emergency Medicine

## 2021-09-10 ENCOUNTER — Other Ambulatory Visit: Payer: Self-pay

## 2021-09-10 DIAGNOSIS — Z7901 Long term (current) use of anticoagulants: Secondary | ICD-10-CM | POA: Insufficient documentation

## 2021-09-10 DIAGNOSIS — I4891 Unspecified atrial fibrillation: Secondary | ICD-10-CM | POA: Insufficient documentation

## 2021-09-10 DIAGNOSIS — R531 Weakness: Secondary | ICD-10-CM | POA: Insufficient documentation

## 2021-09-10 DIAGNOSIS — Z87891 Personal history of nicotine dependence: Secondary | ICD-10-CM | POA: Diagnosis not present

## 2021-09-10 DIAGNOSIS — R42 Dizziness and giddiness: Secondary | ICD-10-CM | POA: Insufficient documentation

## 2021-09-10 DIAGNOSIS — I10 Essential (primary) hypertension: Secondary | ICD-10-CM | POA: Insufficient documentation

## 2021-09-10 DIAGNOSIS — Z79899 Other long term (current) drug therapy: Secondary | ICD-10-CM | POA: Insufficient documentation

## 2021-09-10 DIAGNOSIS — R55 Syncope and collapse: Secondary | ICD-10-CM

## 2021-09-10 LAB — CBC
HCT: 44 % (ref 36.0–46.0)
Hemoglobin: 13.9 g/dL (ref 12.0–15.0)
MCH: 27.9 pg (ref 26.0–34.0)
MCHC: 31.6 g/dL (ref 30.0–36.0)
MCV: 88.2 fL (ref 80.0–100.0)
Platelets: 255 10*3/uL (ref 150–400)
RBC: 4.99 MIL/uL (ref 3.87–5.11)
RDW: 14.7 % (ref 11.5–15.5)
WBC: 4 10*3/uL (ref 4.0–10.5)
nRBC: 0 % (ref 0.0–0.2)

## 2021-09-10 LAB — BASIC METABOLIC PANEL
Anion gap: 6 (ref 5–15)
BUN: 14 mg/dL (ref 8–23)
CO2: 26 mmol/L (ref 22–32)
Calcium: 9.5 mg/dL (ref 8.9–10.3)
Chloride: 109 mmol/L (ref 98–111)
Creatinine, Ser: 1.09 mg/dL — ABNORMAL HIGH (ref 0.44–1.00)
GFR, Estimated: 52 mL/min — ABNORMAL LOW (ref >60)
Glucose, Bld: 104 mg/dL — ABNORMAL HIGH (ref 70–99)
Potassium: 3.9 mmol/L (ref 3.5–5.1)
Sodium: 141 mmol/L (ref 135–145)

## 2021-09-10 LAB — TROPONIN I (HIGH SENSITIVITY)
Troponin I (High Sensitivity): 28 ng/L — ABNORMAL HIGH (ref <18)
Troponin I (High Sensitivity): 31 ng/L — ABNORMAL HIGH (ref <18)

## 2021-09-10 LAB — PROTIME-INR
INR: 1 (ref 0.8–1.2)
Prothrombin Time: 12.8 seconds (ref 11.4–15.2)

## 2021-09-10 NOTE — ED Provider Notes (Signed)
[facility] Emergency Department Provider Note  Time seen: 8:14 PM  I have reviewed the triage vital signs and the nursing notes.   HISTORY  Chief Complaint Irregular Heart Beat   HPI MARLAINE YA is a 80 y.o. female with a past medical history of a prior brain aneurysm hypertension, hyperlipidemia, presents to the emergency department sent by her PCP for medical evaluation.  According to the patient 2 to 3 days ago the patient had an episode of lightheadedness where she felt like she could pass out.  States she had a second episode similar to this about a week or 2 prior.  Patient saw her doctor today was noted to be in atrial fibrillation which is chronic for the patient, she takes Eliquis for this.  Patient states because of her prior aneurysm her doctor sent her to the emergency department for evaluation.  Patient denies any headache, no weakness or numbness confusion or difficulty speaking.  Patient states she is feeling well currently.  Denies any recent fever cough congestion shortness of breath.   Past Medical History:  Diagnosis Date   Brain aneurysm 1996   Hyperlipemia    Hypertension    Motion sickness    boats, long car rides   Wears dentures    full upper    Patient Active Problem List   Diagnosis Date Noted   TIA (transient ischemic attack) 11/26/2017    Past Surgical History:  Procedure Laterality Date   ABDOMINAL HYSTERECTOMY     BACK SURGERY     BRAIN SURGERY  1996   CATARACT EXTRACTION W/PHACO Right 02/06/2021   Procedure: CATARACT EXTRACTION PHACO AND INTRAOCULAR LENS PLACEMENT (IOC) RIGHT 5.43 01:08.1 8.0%;  Surgeon: Leandrew Koyanagi, MD;  Location: [place];  Service: Ophthalmology;  Laterality: Right;   CATARACT EXTRACTION W/PHACO Left 02/18/2021   Procedure: CATARACT EXTRACTION PHACO AND INTRAOCULAR LENS PLACEMENT (IOC) LEFT 8.25 01:13.2 11.2%;  Surgeon: Leandrew Koyanagi, MD;  Location: [place];   Service: Ophthalmology;  Laterality: Left;   CHOLECYSTECTOMY      Prior to Admission medications   Medication Sig Start Date End Date Taking? Authorizing Provider  ALPRAZolam (XANAX) 0.25 MG tablet Take 0.25 mg by mouth. Prn when traveling for panic attack per pt    [provider]  amLODipine (NORVASC) 10 MG tablet Take 10 mg by mouth daily.    [provider]  apixaban (ELIQUIS) 5 MG TABS tablet Take 1 tablet (5 mg total) by mouth 2 (two) times daily. 11/27/17   Salary, Avel Peace, MD  Brimonidine Tartrate-Timolol (COMBIGAN OP) Apply to eye in the morning and at bedtime.    [provider]  cholecalciferol (VITAMIN D) 1000 units tablet Take 5,000 Units by mouth daily.    [provider]  fluticasone (FLONASE) 50 MCG/ACT nasal spray Place 1 spray into both nostrils daily as needed for allergies or rhinitis.    [provider]  HYDROcodone-acetaminophen (NORCO/VICODIN) 5-325 MG tablet One tablet every four hours for pain. 05/26/21   Sanjuana Kava, MD  latanoprost (XALATAN) 0.005 % ophthalmic solution 1 drop at bedtime.    [provider]  lisinopril (PRINIVIL,ZESTRIL) 20 MG tablet Take 20 mg by mouth daily.    [provider]  Multiple Vitamins-Minerals (WOMENS 50+ MULTI VITAMIN/MIN PO) Take by mouth.    [provider]  omega-3 acid ethyl esters (LOVAZA) 1 g capsule Take 1 g by mouth daily.    [provider]  Allergies  Allergen Reactions   Elemental Sulfur    Sulfur Other (See Comments)    Family History  Problem Relation Age of Onset   Breast cancer Maternal Grandmother     Social History Social History   Tobacco Use   Smoking status: Former    Types: Cigarettes    Quit date: 1994    Years since quitting: 28.8   Smokeless tobacco: Never  Vaping Use   Vaping Use: Never used  Substance Use Topics   Alcohol use: No   Drug use: No    Review of Systems Constitutional: Negative for  fever. Cardiovascular: Negative for chest pain. Respiratory: Negative for shortness of breath. Gastrointestinal: Negative for abdominal pain, vomiting and diarrhea. 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 Skin: Negative for skin complaints  Neurological: Negative for headache.  Negative for weakness or numbness. All other ROS negative  ____________________________________________   PHYSICAL EXAM:  VITAL SIGNS: ED Triage Vitals [09/10/21 1712]  Enc Vitals Group     BP (!) 139/91     Pulse Rate 79     Resp 18     Temp 97.9 F (36.6 C)     Temp Source Oral     SpO2 98 %     Weight 188 lb (85.3 kg)     Height 5\' 7"  (1.702 m)     Head Circumference      Peak Flow      Pain Score 0     Pain Loc      Pain Edu?      Excl. in [place]?     Constitutional: Alert and oriented. Well appearing and in no distress. Eyes: Normal exam ENT      Head: Normocephalic and atraumatic.      Mouth/Throat: Mucous membranes are moist. Cardiovascular: Normal rate, regular rhythm.  Respiratory: Normal respiratory effort without tachypnea nor retractions. Breath sounds are clear  Gastrointestinal: Soft and nontender. No distention. Musculoskeletal: Nontender with normal range of motion in all extremities.  Neurologic:  Normal speech and language. No gross focal neurologic deficits  Skin:  Skin is warm, dry and intact.  Psychiatric: Mood and affect are normal.  ____________________________________________    EKG  EKG viewed and interpreted by myself shows atrial fibrillation at 107 bpm with a narrow QRS, normal axis, normal intervals, no concerning ST changes.  History of atrial fibrillation.  ____________________________________________    RADIOLOGY  Chest x-ray negative  ____________________________________________   INITIAL IMPRESSION / ASSESSMENT AND PLAN / ED COURSE  Pertinent labs & imaging results that were available  during my care of the patient were reviewed by me and considered in my medical decision making (see chart for details).   Patient presents emergency department for evaluation of weakness/lightheadedness fell earlier this week found to be in atrial fibrillation around 110 bpm at her PCP and was sent to the ER.  Currently patient's heart rate is 75 while I am in the room, continues to be in atrial fibrillation which is chronic and the patient is on Eliquis.  Patient's initial lab work is largely nonrevealing troponin of 28.  We will repeat a troponin as a precaution.  Patient states her doctor is also concerned given her prior aneurysm.  Patient has no headache, no complaint of any weakness or numbness, etc.  However given her history we will obtain a CT scan of the head as a precaution.  Patient agreeable to plan of care.  CT  scan shows no acute abnormality.  Repeat troponin largely unchanged.  Given the patient's reassuring work-up I believe she is safe for discharge home however I do believe she should follow-up with cardiology for recheck/reevaluation and consideration of possible Holter monitoring.  Patient agreeable to plan of care.  NECHA HARRIES was evaluated in Emergency Department on 09/10/2021 for the symptoms described in the history of present illness. She was evaluated in the context of the global COVID-19 pandemic, which necessitated consideration that the patient might be at risk for infection with the SARS-CoV-2 virus that causes COVID-19. Institutional protocols and algorithms that pertain to the evaluation of patients at risk for COVID-19 are in a state of rapid change based on information released by regulatory bodies including the CDC and federal and state organizations. These policies and algorithms were followed during the patient's care in the ED.  ____________________________________________   FINAL CLINICAL IMPRESSION(S) / ED DIAGNOSES  Weakness   Minna Antis,  MD 09/10/21 2146

## 2021-09-10 NOTE — ED Notes (Signed)
Pt to CT

## 2021-09-10 NOTE — ED Triage Notes (Signed)
Pt was sent here from her primary for afib. Pt has a hx of afib and is currently in RVR with a rate of 108. Pt is asymptomatic and denies pain. Pt in NAD in triage.

## 2021-09-10 NOTE — Discharge Instructions (Signed)
Please follow-up with your cardiologist for recheck/reevaluation and consideration of a possible Holter monitor.  Please drink plenty of fluids, return to the emergency department for any further episodes of weakness, any chest pain or any other symptom personally concerning to yourself.

## 2021-09-13 ENCOUNTER — Encounter: Payer: Self-pay | Admitting: Emergency Medicine

## 2021-09-13 ENCOUNTER — Emergency Department: Payer: Medicare HMO

## 2021-09-13 ENCOUNTER — Other Ambulatory Visit: Payer: Self-pay

## 2021-09-13 ENCOUNTER — Observation Stay
Admission: EM | Admit: 2021-09-13 | Discharge: 2021-09-14 | Disposition: A | Discharge status: Home / Self Care | Payer: Medicare HMO | Attending: Internal Medicine | Admitting: Internal Medicine

## 2021-09-13 ENCOUNTER — Observation Stay
Admit: 2021-09-13 | Discharge: 2021-09-13 | Disposition: A | Discharge status: Home / Self Care | Payer: Medicare HMO | Attending: Internal Medicine | Admitting: Internal Medicine

## 2021-09-13 DIAGNOSIS — Z79899 Other long term (current) drug therapy: Secondary | ICD-10-CM | POA: Diagnosis not present

## 2021-09-13 DIAGNOSIS — Z87891 Personal history of nicotine dependence: Secondary | ICD-10-CM | POA: Diagnosis not present

## 2021-09-13 DIAGNOSIS — F419 Anxiety disorder, unspecified: Secondary | ICD-10-CM | POA: Diagnosis present

## 2021-09-13 DIAGNOSIS — I482 Chronic atrial fibrillation, unspecified: Secondary | ICD-10-CM | POA: Diagnosis not present

## 2021-09-13 DIAGNOSIS — I10 Essential (primary) hypertension: Secondary | ICD-10-CM | POA: Diagnosis not present

## 2021-09-13 DIAGNOSIS — R2689 Other abnormalities of gait and mobility: Secondary | ICD-10-CM | POA: Insufficient documentation

## 2021-09-13 DIAGNOSIS — R2681 Unsteadiness on feet: Secondary | ICD-10-CM | POA: Diagnosis not present

## 2021-09-13 DIAGNOSIS — R55 Syncope and collapse: Secondary | ICD-10-CM | POA: Diagnosis not present

## 2021-09-13 DIAGNOSIS — N179 Acute kidney failure, unspecified: Secondary | ICD-10-CM | POA: Diagnosis present

## 2021-09-13 DIAGNOSIS — I4821 Permanent atrial fibrillation: Secondary | ICD-10-CM | POA: Diagnosis not present

## 2021-09-13 DIAGNOSIS — R0602 Shortness of breath: Secondary | ICD-10-CM | POA: Insufficient documentation

## 2021-09-13 DIAGNOSIS — Z20822 Contact with and (suspected) exposure to covid-19: Secondary | ICD-10-CM | POA: Insufficient documentation

## 2021-09-13 DIAGNOSIS — R0789 Other chest pain: Secondary | ICD-10-CM | POA: Diagnosis not present

## 2021-09-13 DIAGNOSIS — R42 Dizziness and giddiness: Secondary | ICD-10-CM

## 2021-09-13 DIAGNOSIS — R079 Chest pain, unspecified: Secondary | ICD-10-CM

## 2021-09-13 DIAGNOSIS — Z7901 Long term (current) use of anticoagulants: Secondary | ICD-10-CM | POA: Insufficient documentation

## 2021-09-13 DIAGNOSIS — E785 Hyperlipidemia, unspecified: Secondary | ICD-10-CM | POA: Diagnosis present

## 2021-09-13 LAB — CBC
HCT: 42.2 % (ref 36.0–46.0)
Hemoglobin: 13.4 g/dL (ref 12.0–15.0)
MCH: 27.9 pg (ref 26.0–34.0)
MCHC: 31.8 g/dL (ref 30.0–36.0)
MCV: 87.9 fL (ref 80.0–100.0)
Platelets: 234 10*3/uL (ref 150–400)
RBC: 4.8 MIL/uL (ref 3.87–5.11)
RDW: 14.7 % (ref 11.5–15.5)
WBC: 3.5 10*3/uL — ABNORMAL LOW (ref 4.0–10.5)
nRBC: 0 % (ref 0.0–0.2)

## 2021-09-13 LAB — RESP PANEL BY RT-PCR (FLU A&B, COVID) ARPGX2
Influenza A by PCR: NEGATIVE
Influenza B by PCR: NEGATIVE
SARS Coronavirus 2 by RT PCR: NEGATIVE

## 2021-09-13 LAB — BASIC METABOLIC PANEL
Anion gap: 5 (ref 5–15)
BUN: 18 mg/dL (ref 8–23)
CO2: 27 mmol/L (ref 22–32)
Calcium: 9.3 mg/dL (ref 8.9–10.3)
Chloride: 110 mmol/L (ref 98–111)
Creatinine, Ser: 1.1 mg/dL — ABNORMAL HIGH (ref 0.44–1.00)
GFR, Estimated: 51 mL/min — ABNORMAL LOW (ref >60)
Glucose, Bld: 108 mg/dL — ABNORMAL HIGH (ref 70–99)
Potassium: 3.6 mmol/L (ref 3.5–5.1)
Sodium: 142 mmol/L (ref 135–145)

## 2021-09-13 LAB — ECHOCARDIOGRAM COMPLETE
AR max vel: 1.63 cm2
AV Peak grad: 6 mmHg
Ao pk vel: 1.22 m/s
Area-P 1/2: 4.63 cm2
Height: 67 in
S' Lateral: 2.6 cm
Weight: 3007.99 oz

## 2021-09-13 LAB — TROPONIN I (HIGH SENSITIVITY)
Troponin I (High Sensitivity): 28 ng/L — ABNORMAL HIGH (ref <18)
Troponin I (High Sensitivity): 29 ng/L — ABNORMAL HIGH (ref <18)

## 2021-09-13 LAB — BRAIN NATRIURETIC PEPTIDE: B Natriuretic Peptide: 162.7 pg/mL — ABNORMAL HIGH (ref 0.0–100.0)

## 2021-09-13 MED ORDER — FLUTICASONE PROPIONATE 50 MCG/ACT NA SUSP
1.0000 | Freq: Every day | NASAL | Status: DC | PRN
  Filled 2021-09-13: qty 16

## 2021-09-13 MED ORDER — ALPRAZOLAM 0.25 MG PO TABS
0.2500 mg | ORAL_TABLET | Freq: Two times a day (BID) | ORAL | Status: DC | PRN
  Administered 2021-09-13: 0.25 mg via ORAL
  Filled 2021-09-13: qty 1

## 2021-09-13 MED ORDER — ASPIRIN 81 MG PO CHEW
324.0000 mg | CHEWABLE_TABLET | Freq: Once | ORAL | Status: AC
  Administered 2021-09-13: 324 mg via ORAL
  Filled 2021-09-13: qty 4

## 2021-09-13 MED ORDER — ACETAMINOPHEN 325 MG PO TABS: 650.0000 mg | ORAL_TABLET | Freq: Four times a day (QID) | ORAL | Status: DC | PRN

## 2021-09-13 MED ORDER — HYDRALAZINE HCL 20 MG/ML IJ SOLN: 5.0000 mg | INTRAMUSCULAR | Status: DC | PRN

## 2021-09-13 MED ORDER — LATANOPROST 0.005 % OP SOLN
1.0000 [drp] | Freq: Every day | OPHTHALMIC | Status: DC
  Administered 2021-09-14: 1 [drp] via OPHTHALMIC
  Filled 2021-09-13 (×2): qty 2.5

## 2021-09-13 MED ORDER — ADULT MULTIVITAMIN W/MINERALS CH
1.0000 | ORAL_TABLET | Freq: Every day | ORAL | Status: DC
  Administered 2021-09-13 – 2021-09-14 (×2): 1 via ORAL
  Filled 2021-09-13 (×2): qty 1

## 2021-09-13 MED ORDER — ONDANSETRON HCL 4 MG/2ML IJ SOLN: 4.0000 mg | Freq: Three times a day (TID) | INTRAMUSCULAR | Status: DC | PRN

## 2021-09-13 MED ORDER — NITROGLYCERIN 0.4 MG SL SUBL: 0.4000 mg | SUBLINGUAL_TABLET | SUBLINGUAL | Status: DC | PRN

## 2021-09-13 MED ORDER — SODIUM CHLORIDE 0.9 % IV SOLN
INTRAVENOUS | Status: DC
  Administered 2021-09-14: 980 mL via INTRAVENOUS

## 2021-09-13 MED ORDER — OMEGA-3-ACID ETHYL ESTERS 1 G PO CAPS
1.0000 g | ORAL_CAPSULE | Freq: Every day | ORAL | Status: DC
  Administered 2021-09-13 – 2021-09-14 (×2): 1 g via ORAL
  Filled 2021-09-13 (×3): qty 1

## 2021-09-13 MED ORDER — APIXABAN 5 MG PO TABS
5.0000 mg | ORAL_TABLET | Freq: Two times a day (BID) | ORAL | Status: DC
  Administered 2021-09-13 – 2021-09-14 (×3): 5 mg via ORAL
  Filled 2021-09-13 (×3): qty 1

## 2021-09-13 MED ORDER — AMLODIPINE BESYLATE 10 MG PO TABS
10.0000 mg | ORAL_TABLET | Freq: Every day | ORAL | Status: DC
  Administered 2021-09-13 – 2021-09-14 (×2): 10 mg via ORAL
  Filled 2021-09-13: qty 2
  Filled 2021-09-13: qty 1

## 2021-09-13 MED ORDER — VITAMIN D 25 MCG (1000 UNIT) PO TABS
5000.0000 [IU] | ORAL_TABLET | Freq: Every day | ORAL | Status: DC
Start: 2021-09-13 — End: 2021-09-14
  Administered 2021-09-13 – 2021-09-14 (×2): 5000 [IU] via ORAL
  Filled 2021-09-13 (×2): qty 5

## 2021-09-13 MED ORDER — ASPIRIN EC 81 MG PO TBEC
81.0000 mg | DELAYED_RELEASE_TABLET | Freq: Every day | ORAL | Status: DC
  Administered 2021-09-14: 81 mg via ORAL
  Filled 2021-09-13: qty 1

## 2021-09-13 MED ORDER — MORPHINE SULFATE (PF) 2 MG/ML IV SOLN: 0.5000 mg | INTRAVENOUS | Status: DC | PRN

## 2021-09-13 MED ORDER — ATORVASTATIN CALCIUM 20 MG PO TABS
40.0000 mg | ORAL_TABLET | Freq: Every day | ORAL | Status: DC
  Administered 2021-09-13 – 2021-09-14 (×2): 40 mg via ORAL
  Filled 2021-09-13 (×2): qty 2

## 2021-09-13 NOTE — H&P (Signed)
History and Physical    Kayla Vaughn JZP:[id number] DOB: 12/22/1940 DOA: 09/13/2021  Referring MD/NP/PA:   PCP: Erasmo Downer, NP   Patient coming from:  The patient is coming from home.  At baseline, pt is independent for most of ADL.        Chief Complaint: chest pain  HPI: Kayla Vaughn is a 80 y.o. 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  Patient states that her chest pain started in early morning. She woke up with chest pain from sleep.  The chest pain is located in substernal area, initally moderate, pressure-like, radiating to both arms, associated with mild shortness of breath.  Currently chest pain is mild.  Patient has mild dry cough, no fever or chills.  Patient does not have nausea, vomiting, diarrhea or abdominal pain.  Patient has increased urinary frequency, denies dysuria or burning on urination.  Of note, patient had lightheadedness in the past several days, which has resolved today.  Currently patient does not have lightheadedness or dizziness.  No facial droop or slurred speech.  She moves all extremities normally.  Patient was seen in ED on 11/17, and had negative CT of head that time.   ED Course: pt was found to have troponin level 28, 29 (patient had troponin 28, 3111/70/22), WBC 3.5, INR 1.0, pending COVID PCR, mild AKI with creatinine 1.10, BUN 18, GFR 51.  Temperature normal, blood pressure 143/86, heart rate 75, RR 17, oxygen saturation 100% on room air.  Chest x-ray negative for infiltration, but showed cardiomegaly.  Patient is placed on progressive bed for observation.  CT-head on 11/17 showed: No acute intracranial abnormality.   Remote right frontal subcortical white matter infarct.   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    Review of Systems:   General: no fevers, chills, no body weight gain, has fatigue HEENT: no blurry vision, hearing changes or sore throat Respiratory: has dyspnea, coughing, no wheezing CV: has chest pain, no palpitations GI: no nausea, vomiting, abdominal pain, diarrhea, constipation GU: no dysuria, burning on urination, has increased urinary frequency, no hematuria  Ext: no leg edema Neuro: no unilateral weakness, numbness, or tingling, no vision change or hearing loss.  Had lightheadedness. Skin: no rash, no skin tear. MSK: No muscle spasm, no deformity, no limitation of range of movement in spin Heme: No easy bruising.  Travel history: No recent long distant travel.  Allergy:  Allergies  Allergen Reactions   Elemental Sulfur    Sulfur Other (See Comments)    Past Medical History:  Diagnosis Date   Brain aneurysm 1996   Hyperlipemia    Hypertension    Motion sickness    boats, long car rides   Wears dentures    full upper    Past Surgical History:  Procedure Laterality Date   ABDOMINAL HYSTERECTOMY     BACK SURGERY     BRAIN SURGERY  1996   CATARACT EXTRACTION W/PHACO Right 02/06/2021   Procedure: CATARACT EXTRACTION PHACO AND INTRAOCULAR LENS PLACEMENT (IOC) RIGHT 5.43 01:08.1 8.0%;  Surgeon: Lockie Mola, MD;  Location: [facility];  Service: Ophthalmology;  Laterality: Right;   CATARACT EXTRACTION W/PHACO Left 02/18/2021   Procedure: CATARACT EXTRACTION PHACO AND INTRAOCULAR LENS PLACEMENT (IOC) LEFT 8.25 01:13.2 11.2%;  Surgeon: Lockie Mola,  MD;  Location: [place];  Service: Ophthalmology;  Laterality: Left;   CHOLECYSTECTOMY      Social History:  reports that she quit smoking about 28 years ago. Her smoking use included cigarettes. She has never used smokeless tobacco. She reports that she does not drink alcohol and does not use drugs.  Family History:  Family History  Problem Relation Age of Onset    Breast cancer Maternal Grandmother      Prior to Admission medications   Medication Sig Start Date End Date Taking? Authorizing Provider  ALPRAZolam (XANAX) 0.25 MG tablet Take 0.25 mg by mouth. Prn when traveling for panic attack per pt    [provider]  amLODipine (NORVASC) 10 MG tablet Take 10 mg by mouth daily.    [provider]  apixaban (ELIQUIS) 5 MG TABS tablet Take 1 tablet (5 mg total) by mouth 2 (two) times daily. 11/27/17   Salary, Avel Peace, MD  Brimonidine Tartrate-Timolol (COMBIGAN OP) Apply to eye in the morning and at bedtime.    [provider]  cholecalciferol (VITAMIN D) 1000 units tablet Take 5,000 Units by mouth daily.    [provider]  fluticasone (FLONASE) 50 MCG/ACT nasal spray Place 1 spray into both nostrils daily as needed for allergies or rhinitis.    [provider]  HYDROcodone-acetaminophen (NORCO/VICODIN) 5-325 MG tablet One tablet every four hours for pain. 05/26/21   Sanjuana Kava, MD  latanoprost (XALATAN) 0.005 % ophthalmic solution 1 drop at bedtime.    [provider]  lisinopril (PRINIVIL,ZESTRIL) 20 MG tablet Take 20 mg by mouth daily.    [provider]  Multiple Vitamins-Minerals (WOMENS 50+ MULTI VITAMIN/MIN PO) Take by mouth.    [provider]  omega-3 acid ethyl esters (LOVAZA) 1 g capsule Take 1 g by mouth daily.    [provider]    Physical Exam: Vitals:   09/13/21 0419 09/13/21 0426 09/13/21 1300 09/13/21 1500  BP:  (!) 143/86 121/79 110/66  Pulse:  75 66 74  Resp:  17 14 (!) 22  Temp:  97.8 F (36.6 C) 97.9 F (36.6 C) 98 F (36.7 C)  TempSrc:   Oral Oral  SpO2:  100% 100% 100%  Weight: 85.3 kg     Height: 5\' 7"  (1.702 m)      General: Not in acute distress HEENT:       Eyes: PERRL, EOMI, no scleral icterus.       ENT: No discharge from the ears and nose, no pharynx injection, no tonsillar enlargement.        Neck: No JVD, no bruit, no mass  felt. Heme: No neck lymph node enlargement. Cardiac: S1/S2, irregularly irregular rhythm, no murmurs, No gallops or rubs. Respiratory: No rales, wheezing, rhonchi or rubs. GI: Soft, nondistended, nontender, no rebound pain, no organomegaly, BS present. GU: No hematuria Ext: No pitting leg edema bilaterally. 1+DP/PT pulse bilaterally. Musculoskeletal: No joint deformities, No joint redness or warmth, no limitation of ROM in spin. Skin: No rashes.  Neuro: Alert, oriented X3, cranial nerves II-XII grossly intact, moves all extremities normally.  Psych: Patient is not psychotic, no suicidal or hemocidal ideation.  Labs on Admission: I have personally reviewed following labs and imaging studies  CBC: Recent Labs  Lab 09/10/21 1717 09/13/21 0423  WBC 4.0 3.5*  HGB 13.9 13.4  HCT 44.0 42.2  MCV 88.2 87.9  PLT 255 [id number]   Basic Metabolic Panel: Recent Labs  Lab 09/10/21 1717  09/13/21 0423  NA 141 142  K 3.9 3.6  CL 109 110  CO2 26 27  GLUCOSE 104* 108*  BUN 14 18  CREATININE 1.09* 1.10*  CALCIUM 9.5 9.3   GFR: Estimated Creatinine Clearance: 47.3 mL/min (A) (by C-G formula based on SCr of 1.1 mg/dL (H)). Liver Function Tests: No results for input(s): AST, ALT, ALKPHOS, BILITOT, PROT, ALBUMIN in the last 168 hours. No results for input(s): LIPASE, AMYLASE in the last 168 hours. No results for input(s): AMMONIA in the last 168 hours. Coagulation Profile: Recent Labs  Lab 09/10/21 1717  INR 1.0   Cardiac Enzymes: No results for input(s): CKTOTAL, CKMB, CKMBINDEX, TROPONINI in the last 168 hours. BNP (last 3 results) No results for input(s): PROBNP in the last 8760 hours. HbA1C: No results for input(s): HGBA1C in the last 72 hours. CBG: No results for input(s): GLUCAP in the last 168 hours. Lipid Profile: No results for input(s): CHOL, HDL, LDLCALC, TRIG, CHOLHDL, LDLDIRECT in the last 72 hours. Thyroid Function Tests: No results for input(s): TSH, T4TOTAL, FREET4,  T3FREE, THYROIDAB in the last 72 hours. Anemia Panel: No results for input(s): VITAMINB12, FOLATE, FERRITIN, TIBC, IRON, RETICCTPCT in the last 72 hours. Urine analysis: No results found for: COLORURINE, APPEARANCEUR, LABSPEC, PHURINE, GLUCOSEU, HGBUR, BILIRUBINUR, KETONESUR, PROTEINUR, UROBILINOGEN, NITRITE, LEUKOCYTESUR Sepsis Labs: @LABRCNTIP (procalcitonin:4,lacticidven:4) ) Recent Results (from the past 240 hour(s))  Resp Panel by RT-PCR (Flu A&B, Covid) Nasopharyngeal Swab     Status: None   Collection Time: 09/13/21 11:00 AM   Specimen: Nasopharyngeal Swab; Nasopharyngeal(NP) swabs in vial transport medium  Result Value Ref Range Status   SARS Coronavirus 2 by RT PCR NEGATIVE NEGATIVE Final    Comment: (NOTE) SARS-CoV-2 target nucleic acids are NOT DETECTED.  The SARS-CoV-2 RNA is generally detectable in upper respiratory specimens during the acute phase of infection. The lowest concentration of SARS-CoV-2 viral copies this assay can detect is 138 copies/mL. A negative result does not preclude SARS-Cov-2 infection and should not be used as the sole basis for treatment or other patient management decisions. A negative result may occur with  improper specimen collection/handling, submission of specimen other than nasopharyngeal swab, presence of viral mutation(s) within the areas targeted by this assay, and inadequate number of viral copies(<138 copies/mL). A negative result must be combined with clinical observations, patient history, and epidemiological information. The expected result is Negative.  Fact Sheet for Patients:  09/15/21  Fact Sheet for Healthcare Providers:  BloggerCourse.com  This test is no t yet approved or cleared by the [employer] FDA and  has been authorized for detection and/or diagnosis of SARS-CoV-2 by FDA under an Emergency Use Authorization (EUA). This EUA will remain  in effect (meaning  this test can be used) for the duration of the COVID-19 declaration under Section 564(b)(1) of the Act, 21 U.S.C.section 360bbb-3(b)(1), unless the authorization is terminated  or revoked sooner.       Influenza A by PCR NEGATIVE NEGATIVE Final   Influenza B by PCR NEGATIVE NEGATIVE Final    Comment: (NOTE) The Xpert Xpress SARS-CoV-2/FLU/RSV plus assay is intended as an aid in the diagnosis of influenza from Nasopharyngeal swab specimens and should not be used as a sole basis for treatment. Nasal washings and aspirates are unacceptable for Xpert Xpress SARS-CoV-2/FLU/RSV testing.  Fact Sheet for Patients: [place]  Fact Sheet for Healthcare Providers: BloggerCourse.com  This test is not yet approved or cleared by the [employer] FDA and has been authorized for detection and/or  diagnosis of SARS-CoV-2 by FDA under an Emergency Use Authorization (EUA). This EUA will remain in effect (meaning this test can be used) for the duration of the COVID-19 declaration under Section 564(b)(1) of the Act, 21 U.S.C. section 360bbb-3(b)(1), unless the authorization is terminated or revoked.  Performed at [facility], [address] [place], [place] [address]      Radiological Exams on Admission: DG Chest 2 View  Result Date: 09/13/2021 CLINICAL DATA:  80 year old female with chest tightness. Substernal chest discomfort that woke her from sleep. Shortness of breath. History of atrial fibrillation. EXAM: CHEST - 2 VIEW COMPARISON:  Chest radiographs 09/10/2021. FINDINGS: Stable cardiomegaly and mediastinal contours. Normal lung volumes. Visualized tracheal air column is within normal limits. Both lungs remain clear. No pneumothorax or pleural effusion. Chronic lower thoracic compression fractures. No acute osseous abnormality identified. Stable cholecystectomy clips. Negative visible bowel gas pattern. IMPRESSION: Stable  cardiomegaly. No acute cardiopulmonary abnormality. Electronically Signed   By: Genevie Ann M.D.   On: 09/13/2021 04:53   ECHOCARDIOGRAM COMPLETE  Result Date: 09/13/2021    ECHOCARDIOGRAM REPORT   Patient Name:   ZYNIA BORGMAN Date of Exam: 09/13/2021 Medical Rec #:  [id number]       Height:       67.0 in Accession #:    [id number]      Weight:       188.0 lb Date of Birth:  1941/01/18        BSA:          1.970 m Patient Age:    46 years        BP:           143/86 mmHg Patient Gender: F               HR:           74 bpm. Exam Location:  [facility] Procedure: 2D Echo Indications:     Chest Pain R07.9  History:         Patient has prior history of Echocardiogram examinations, most                  recent 11/26/2017.  Sonographer:     Kathlen Brunswick RDCS Referring Phys:  Unknown Foley Gift Rueckert Diagnosing Phys: Donnelly Angelica IMPRESSIONS  1. Left ventricular ejection fraction, by estimation, is 60 to 65%. The left ventricle has normal function. The left ventricle has no regional wall motion abnormalities. There is moderate left ventricular hypertrophy. Left ventricular diastolic parameters are indeterminate.  2. Right ventricular systolic function was not well visualized. The right ventricular size is mildly enlarged.  3. Right atrial size was mildly dilated.  4. The mitral valve is normal in structure. No evidence of mitral valve regurgitation. No evidence of mitral stenosis. Moderate mitral annular calcification.  5. The aortic valve is normal in structure. Aortic valve regurgitation is not visualized. No aortic stenosis is present.  6. The inferior vena cava is normal in size with greater than 50% respiratory variability, suggesting right atrial pressure of 3 mmHg. FINDINGS  Left Ventricle: Left ventricular ejection fraction, by estimation, is 60 to 65%. The left ventricle has normal function. The left ventricle has no regional wall motion abnormalities. The left ventricular internal cavity size was normal in size. There is   moderate left ventricular hypertrophy. Left ventricular diastolic parameters are indeterminate. Right Ventricle: The right ventricular size is mildly enlarged. Right vetricular wall thickness was not well visualized. Right ventricular systolic function  was not well visualized. Left Atrium: Left atrial size was normal in size. Right Atrium: Right atrial size was mildly dilated. Pericardium: There is no evidence of pericardial effusion. Mitral Valve: The mitral valve is normal in structure. Moderate mitral annular calcification. No evidence of mitral valve regurgitation. No evidence of mitral valve stenosis. Tricuspid Valve: The tricuspid valve is normal in structure. Tricuspid valve regurgitation is not demonstrated. Aortic Valve: The aortic valve is normal in structure. Aortic valve regurgitation is not visualized. No aortic stenosis is present. Aortic valve peak gradient measures 6.0 mmHg. Pulmonic Valve: The pulmonic valve was not well visualized. Pulmonic valve regurgitation is not visualized. No evidence of pulmonic stenosis. Aorta: The aortic root is normal in size and structure. Venous: The inferior vena cava is normal in size with greater than 50% respiratory variability, suggesting right atrial pressure of 3 mmHg. IAS/Shunts: The interatrial septum was not well visualized.  LEFT VENTRICLE PLAX 2D LVIDd:         3.90 cm   Diastology LVIDs:         2.60 cm   LV e' medial:    5.66 cm/s LV PW:         1.50 cm   LV E/e' medial:  17.4 LV IVS:        1.40 cm   LV e' lateral:   6.09 cm/s LVOT diam:     1.80 cm   LV E/e' lateral: 16.2 LV SV:         34 LV SV Index:   17 LVOT Area:     2.54 cm  LEFT ATRIUM           Index        RIGHT ATRIUM           Index LA diam:      4.20 cm 2.13 cm/m   RA Area:     23.30 cm LA Vol (A2C): 33.1 ml 16.80 ml/m  RA Volume:   75.90 ml  38.53 ml/m LA Vol (A4C): 52.6 ml 26.70 ml/m  AORTIC VALVE                 PULMONIC VALVE AV Area (Vmax): 1.63 cm     PV Vmax:       0.92 m/s AV  Vmax:        122.00 cm/s  PV Peak grad:  3.4 mmHg AV Peak Grad:   6.0 mmHg LVOT Vmax:      78.30 cm/s LVOT Vmean:     48.700 cm/s LVOT VTI:       0.132 m  AORTA Ao Root diam: 3.10 cm Ao Asc diam:  3.60 cm MITRAL VALVE MV Area (PHT): 4.63 cm    SHUNTS MV Decel Time: 164 msec    Systemic VTI:  0.13 m MV E velocity: 98.50 cm/s  Systemic Diam: 1.80 cm Donnelly Angelica Electronically signed by Donnelly Angelica Signature Date/Time: 09/13/2021/3:27:12 PM    Final      EKG: I have personally reviewed.  Atrial fibrillation, QTC 472, LAD, T wave inversion in inferior leads and lead I/aVL, poor R wave progression  Assessment/Plan Principal Problem:   Chest pain Active Problems:   Hypertension   HLD (hyperlipidemia)   Anxiety   Atrial fibrillation, chronic (HCC)   AKI (acute kidney injury) ([place])   Lightheadedness   Chest pain and elevated trop: trop 28 --> 29. Dr. Corky Sox of card is consulted, planning to do stress test in AM  - place  to progressive unit for observation - Trend Trop - prn Nitroglycerin, Morphine - start aspirin, lipitor  - Risk factor stratification: will check FLP and A1C  - 2d echo -NPO after MN for stress test  HLD: -started lipitor 40 mg daily -f/u FLP  Hypertension -IV hydralazine as needed -Amlodipine -Hold lisinopril due to AKI  Anxiety -Continue home as needed Xanax  Atrial fibrillation, chronic ([place]): Heart rate 75.  Patient is not on nodal blockers -Continue Eliquis  AKI (acute kidney injury) ([place]): Mild, GFR 51, creatinine 1.10, BUN 18.  Likely due to dehydration and continuation of her lisinopril. -Hold lisinopril -IV fluid: 75 cc/h of normal saline  Lightheadedness: Etiology is not clear.  CT of the head was negative on 11/17.  No focal neurodeficit on physical examination. -Check orthostatic vital sign -IV fluid -Follow-up 2D echo -pt/ot    DVT ppx: on Eliquis Code Status: Full code Family Communication:  Yes, patient's daughter and son at bed  side Disposition Plan:  Anticipate discharge back to previous environment Consults called: Dr. Corky Sox of cardiology Admission status and Level of care: Progressive:   for obs    Status is: Observation  The patient remains OBS appropriate and will d/c before 2 midnights.         Date of Service 09/13/2021    Ivor Costa [facility]   If 7PM-7AM, please contact night-coverage [email or web address] 09/13/2021, 6:10 PM

## 2021-09-13 NOTE — ED Notes (Signed)
Pt to ED via POV with c/o substernal chest tightness that woke patient up out of sleep PTA. Pt c/o radiation arm and SOB as well. Pt states hx of A-fib at this time.

## 2021-09-13 NOTE — ED Notes (Signed)
Patient requests to leave AMA if she can not go upstairs to a room right now. Patient encouraged to stay. MD informed. Doctor stated patient would have to sign out AMA as she was not ready for discharge.

## 2021-09-13 NOTE — ED Provider Notes (Signed)
[facility] Emergency Department Provider Note  ____________________________________________   Event Date/Time   First MD Initiated Contact with Patient 09/13/21 [id number]     (approximate)  I have reviewed the triage vital signs and the nursing notes.   HISTORY  Chief Complaint Chest Pain    HPI Kayla Vaughn is a 80 y.o. female  with h/o HTN, HLD, here with chest pain, palpitations. Pt reports that she awoke this morning with chest pain, pressure, and SOB. Felt fine going to bed other than being somewhat weak/tired throughout the day yesterday. Awoke this AM with dull, pressure like CP, lightheadedness, nausea, and SOB. No diaphoresis. Sx persisted x several hours but have now resolved. H/o somewhat similar issues several days ago, was seen and cleared. She denies any palpitations but cannot tell if/when she is in AFib. Has been taking her meds as prescribed. No current CP. No leg swelling. No h/o DVT/PE.         Past Medical History:  Diagnosis Date   Brain aneurysm 1996   Hyperlipemia    Hypertension    Motion sickness    boats, long car rides   Wears dentures    full upper    Patient Active Problem List   Diagnosis Date Noted   TIA (transient ischemic attack) 11/26/2017    Past Surgical History:  Procedure Laterality Date   ABDOMINAL HYSTERECTOMY     BACK SURGERY     BRAIN SURGERY  1996   CATARACT EXTRACTION W/PHACO Right 02/06/2021   Procedure: CATARACT EXTRACTION PHACO AND INTRAOCULAR LENS PLACEMENT (IOC) RIGHT 5.43 01:08.1 8.0%;  Surgeon: Leandrew Koyanagi, MD;  Location: [place];  Service: Ophthalmology;  Laterality: Right;   CATARACT EXTRACTION W/PHACO Left 02/18/2021   Procedure: CATARACT EXTRACTION PHACO AND INTRAOCULAR LENS PLACEMENT (IOC) LEFT 8.25 01:13.2 11.2%;  Surgeon: Leandrew Koyanagi, MD;  Location: [place];  Service: Ophthalmology;  Laterality: Left;   CHOLECYSTECTOMY      Prior to Admission  medications   Medication Sig Start Date End Date Taking? Authorizing Provider  ALPRAZolam (XANAX) 0.25 MG tablet Take 0.25 mg by mouth. Prn when traveling for panic attack per pt    [provider]  amLODipine (NORVASC) 10 MG tablet Take 10 mg by mouth daily.    [provider]  apixaban (ELIQUIS) 5 MG TABS tablet Take 1 tablet (5 mg total) by mouth 2 (two) times daily. 11/27/17   Salary, Avel Peace, MD  Brimonidine Tartrate-Timolol (COMBIGAN OP) Apply to eye in the morning and at bedtime.    [provider]  cholecalciferol (VITAMIN D) 1000 units tablet Take 5,000 Units by mouth daily.    [provider]  fluticasone (FLONASE) 50 MCG/ACT nasal spray Place 1 spray into both nostrils daily as needed for allergies or rhinitis.    [provider]  HYDROcodone-acetaminophen (NORCO/VICODIN) 5-325 MG tablet One tablet every four hours for pain. 05/26/21   Sanjuana Kava, MD  latanoprost (XALATAN) 0.005 % ophthalmic solution 1 drop at bedtime.    [provider]  lisinopril (PRINIVIL,ZESTRIL) 20 MG tablet Take 20 mg by mouth daily.    [provider]  Multiple Vitamins-Minerals (WOMENS 50+ MULTI VITAMIN/MIN PO) Take by mouth.    [provider]  omega-3 acid ethyl esters (LOVAZA) 1 g capsule Take 1 g by mouth daily.    [provider]    Allergies Elemental sulfur and Sulfur  Family History  Problem Relation Age of Onset  Breast cancer Maternal Grandmother     Social History Social History   Tobacco Use   Smoking status: Former    Types: Cigarettes    Quit date: 1994    Years since quitting: 28.9   Smokeless tobacco: Never  Vaping Use   Vaping Use: Never used  Substance Use Topics   Alcohol use: No   Drug use: No    Review of Systems  Review of Systems  Constitutional:  Positive for fatigue. Negative for fever.  HENT:  Negative for congestion and sore throat.   Eyes:  Negative for visual disturbance.   Respiratory:  Positive for shortness of breath. Negative for cough.   Cardiovascular:  Positive for chest pain.  Gastrointestinal:  Negative for abdominal pain, diarrhea, nausea and vomiting.  Genitourinary:  Negative for flank pain.  Musculoskeletal:  Negative for back pain and neck pain.  Skin:  Negative for rash and wound.  Neurological:  Positive for weakness and light-headedness.  All other systems reviewed and are negative.   ____________________________________________  PHYSICAL EXAM:      VITAL SIGNS: ED Triage Vitals  Enc Vitals Group     BP 09/13/21 0426 (!) 143/86     Pulse Rate 09/13/21 0426 75     Resp 09/13/21 0426 17     Temp 09/13/21 0426 97.8 F (36.6 C)     Temp src --      SpO2 09/13/21 0426 100 %     Weight 09/13/21 0419 188 lb (85.3 kg)     Height 09/13/21 0419 5\' 7"  (1.702 m)     Head Circumference --      Peak Flow --      Pain Score 09/13/21 0419 5     Pain Loc --      Pain Edu? --      Excl. in [place]? --      Physical Exam Vitals and nursing note reviewed.  Constitutional:      General: She is not in acute distress.    Appearance: She is well-developed.  HENT:     Head: Normocephalic and atraumatic.  Eyes:     Conjunctiva/sclera: Conjunctivae normal.  Cardiovascular:     Rate and Rhythm: Normal rate. Rhythm irregularly irregular.     Heart sounds: Normal heart sounds. No murmur heard.   No friction rub.  Pulmonary:     Effort: Pulmonary effort is normal. No respiratory distress.     Breath sounds: Normal breath sounds. No wheezing or rales.  Abdominal:     General: There is no distension.     Palpations: Abdomen is soft.     Tenderness: There is no abdominal tenderness.  Musculoskeletal:     Cervical back: Neck supple.  Skin:    General: Skin is warm.     Capillary Refill: Capillary refill takes less than 2 seconds.  Neurological:     Mental Status: She is alert and oriented to person, place, and time.     Motor: No abnormal muscle  tone.      ____________________________________________   LABS (all labs ordered are listed, but only abnormal results are displayed)  Labs Reviewed  BASIC METABOLIC PANEL - Abnormal; Notable for the following components:      Result Value   Glucose, Bld 108 (*)    Creatinine, Ser 1.10 (*)    GFR, Estimated 51 (*)    All other components within normal limits  CBC - Abnormal; Notable for the following components:  WBC 3.5 (*)    All other components within normal limits  TROPONIN I (HIGH SENSITIVITY) - Abnormal; Notable for the following components:   Troponin I (High Sensitivity) 28 (*)    All other components within normal limits  TROPONIN I (HIGH SENSITIVITY) - Abnormal; Notable for the following components:   Troponin I (High Sensitivity) 29 (*)    All other components within normal limits    ____________________________________________  EKG: Atrial fibrillation, VR 83. QRS 88, QTc 472. No acute ST elevations or depressions. No ischemia or infarct. ________________________________________  RADIOLOGY All imaging, including plain films, CT scans, and ultrasounds, independently reviewed by me, and interpretations confirmed via formal radiology reads.  ED MD interpretation:   CXR: Clear, stable cardiomegaly  Official radiology report(s): DG Chest 2 View  Result Date: 09/13/2021 CLINICAL DATA:  80 year old female with chest tightness. Substernal chest discomfort that woke her from sleep. Shortness of breath. History of atrial fibrillation. EXAM: CHEST - 2 VIEW COMPARISON:  Chest radiographs 09/10/2021. FINDINGS: Stable cardiomegaly and mediastinal contours. Normal lung volumes. Visualized tracheal air column is within normal limits. Both lungs remain clear. No pneumothorax or pleural effusion. Chronic lower thoracic compression fractures. No acute osseous abnormality identified. Stable cholecystectomy clips. Negative visible bowel gas pattern. IMPRESSION: Stable cardiomegaly.  No acute cardiopulmonary abnormality. Electronically Signed   By: Odessa Fleming M.D.   On: 09/13/2021 04:53    ____________________________________________  PROCEDURES   Procedure(s) performed (including Critical Care):  .1-3 Lead EKG Interpretation Performed by: Shaune Pollack, MD Authorized by: Shaune Pollack, MD     Interpretation: abnormal     ECG rate:  70-90   ECG rate assessment: normal     Rhythm: atrial fibrillation     Ectopy: none     Conduction: normal   Comments:     Indication: SOB, presyncope  ____________________________________________  INITIAL IMPRESSION / MDM / ASSESSMENT AND PLAN / ED COURSE  As part of my medical decision making, I reviewed the following data within the electronic MEDICAL RECORD NUMBER Nursing notes reviewed and incorporated, Old chart reviewed, Notes from prior ED visits, and [place] Controlled Substance Database       *PHELICIA DANTES was evaluated in Emergency Department on 09/13/2021 for the symptoms described in the history of present illness. She was evaluated in the context of the global COVID-19 pandemic, which necessitated consideration that the patient might be at risk for infection with the SARS-CoV-2 virus that causes COVID-19. Institutional protocols and algorithms that pertain to the evaluation of patients at risk for COVID-19 are in a state of rapid change based on information released by regulatory bodies including the CDC and federal and state organizations. These policies and algorithms were followed during the patient's care in the ED.  Some ED evaluations and interventions may be delayed as a result of limited staffing during the pandemic.*     Medical Decision Making:  80 yo F here with recurrent near syncopal episode. Pt in AFib with controlled rate on arrival. Last stress was 2020 per review of records and [facility] records. No known h/o CAD. Labs today show trop 29, stable. BMP with normal lytes. CBC without anemia or  significant leukocytosis. CXR is clear. Given recurrent presyncope with escalating CP at rest, concern for angina or arrhythmia. Given age and risk factors, will plan to observe in hospital.   ____________________________________________  FINAL CLINICAL IMPRESSION(S) / ED DIAGNOSES  Final diagnoses:  None     MEDICATIONS GIVEN DURING THIS VISIT:  Medications - No data to display   ED Discharge Orders     None        Note:  This document was prepared using Dragon voice recognition software and may include unintentional dictation errors.   Duffy Bruce, MD 09/13/21 1022

## 2021-09-13 NOTE — Progress Notes (Signed)
*  PRELIMINARY RESULTS* Echocardiogram 2D Echocardiogram has been performed.  Lenor Coffin 09/13/2021, 3:00 PM

## 2021-09-13 NOTE — ED Notes (Signed)
Witnessed by Dahlia Bailiff RN conversation with patient encouraging her to stay and explaining risks up to possible death if patient were to leave AMA. She stated she would stay, but only until her son returns and then she was leaving.

## 2021-09-13 NOTE — Consult Note (Signed)
[facility] Cardiology  CARDIOLOGY CONSULT NOTE  Patient ID: Kayla Vaughn MRN: [id number] DOB/AGE: Aug 15, 1941 80 y.o.  Admit date: 09/13/2021 Referring Physician Lorretta Harp Primary Physician Erasmo Downer, NP Primary Cardiologist Donnetta Simpers Reason for Consultation Chest pain  HPI:  Kayla Vaughn is a 80 year old female with history of permanent A. fib, TIA (11/2017), hypertension, hyperlipidemia who presents to the emergency department for evaluation of palpitations and chest pressure.  She was seen in the emergency department on 09/10/2021 for complaints of dizziness at which time she had a CT of the head which was negative. She said at the time she felt like she was going to black out. The dizziness has been intermittent since that time .  She woke up this morning with a feeling as though she had "blacked out" and all the sudden came back. She says that her left arm, then her right arm started hurting, followed by tightness in her chest along with some shortness of breath. Her symptoms started resolving by the time EMS arrived. She says she had 2 other instances in the past week where "the life was out of her" like she was going to black out.   Over the last few weeks she has had issues with her back and knee, and just doesn't feel good. No LE edema, orthopnea.   On arrival to the emergency department her vital signs are notable for heart rate of 75 and a blood pressure of 143/86.  Her EKG shows atrial fibrillation with a left axis deviation, and nonspecific T wave abnormalities.  Her troponin is 28 and flat.  Review of systems complete and found to be negative unless listed above     Past Medical History:  Diagnosis Date   Brain aneurysm 1996   Hyperlipemia    Hypertension    Motion sickness    boats, long car rides   Wears dentures    full upper    Past Surgical History:  Procedure Laterality Date   ABDOMINAL HYSTERECTOMY     BACK SURGERY     BRAIN SURGERY  1996    CATARACT EXTRACTION W/PHACO Right 02/06/2021   Procedure: CATARACT EXTRACTION PHACO AND INTRAOCULAR LENS PLACEMENT (IOC) RIGHT 5.43 01:08.1 8.0%;  Surgeon: Lockie Mola, MD;  [facility];  Service: Ophthalmology;  Laterality: Right;   CATARACT EXTRACTION W/PHACO Left 02/18/2021   Procedure: CATARACT EXTRACTION PHACO AND INTRAOCULAR LENS PLACEMENT (IOC) LEFT 8.25 01:13.2 11.2%;  Surgeon: Lockie Mola, MD;  Location: [facility];  Service: Ophthalmology;  Laterality: Left;   CHOLECYSTECTOMY      (Not in a hospital admission)  Social History   Socioeconomic History   Marital status: Legally Separated    Spouse name: Not on file   Number of children: Not on file   Years of education: Not on file   Highest education level: Not on file  Occupational History   Occupation: retired  Tobacco Use   Smoking status: Former    Types: Cigarettes    Quit date: 1994    Years since quitting: 28.9   Smokeless tobacco: Never  Vaping Use   Vaping Use: Never used  Substance and Sexual Activity   Alcohol use: No   Drug use: No   Sexual activity: Not on file  Other Topics Concern   Not on file  Social History Narrative   Not on file   Social Determinants of Health   Financial Resource Strain: Not on file  Food Insecurity: Not on file  Transportation Needs: Not on file  Physical Activity: Not on file  Stress: Not on file  Social Connections: Not on file  Intimate Partner Violence: Not on file    Family History  Problem Relation Age of Onset   Breast cancer Maternal Grandmother       Review of systems complete and found to be negative unless listed above      PHYSICAL EXAM  General: Well developed, well nourished, in no acute distress HEENT:  Normocephalic and atramatic Neck:  No JVD.  Lungs: Clear bilaterally to auscultation and percussion. Heart: Irregularly irregular . Normal S1 and S2 without gallops or murmurs.  Abdomen: Bowel sounds  are positive, abdomen soft and non-tender  Msk:  Back normal, normal gait. Normal strength and tone for age. Extremities: No clubbing, cyanosis or edema.   Neuro: Alert and oriented X 3. Psych:  Good affect, responds appropriately  Labs:   Lab Results  Component Value Date   WBC 3.5 (L) 09/13/2021   HGB 13.4 09/13/2021   HCT 42.2 09/13/2021   MCV 87.9 09/13/2021   PLT 234 09/13/2021    Recent Labs  Lab 09/13/21 0423  NA 142  K 3.6  CL 110  CO2 27  BUN 18  CREATININE 1.10*  CALCIUM 9.3  GLUCOSE 108*   Lab Results  Component Value Date   TROPONINI <0.03 11/25/2017    Lab Results  Component Value Date   CHOL 183 11/26/2017   Lab Results  Component Value Date   HDL 46 11/26/2017   Lab Results  Component Value Date   LDLCALC 119 (H) 11/26/2017   Lab Results  Component Value Date   TRIG 88 11/26/2017   Lab Results  Component Value Date   CHOLHDL 4.0 11/26/2017   No results found for: LDLDIRECT    Radiology: DG Chest 2 View  Result Date: 09/13/2021 CLINICAL DATA:  80 year old female with chest tightness. Substernal chest discomfort that woke her from sleep. Shortness of breath. History of atrial fibrillation. EXAM: CHEST - 2 VIEW COMPARISON:  Chest radiographs 09/10/2021. FINDINGS: Stable cardiomegaly and mediastinal contours. Normal lung volumes. Visualized tracheal air column is within normal limits. Both lungs remain clear. No pneumothorax or pleural effusion. Chronic lower thoracic compression fractures. No acute osseous abnormality identified. Stable cholecystectomy clips. Negative visible bowel gas pattern. IMPRESSION: Stable cardiomegaly. No acute cardiopulmonary abnormality. Electronically Signed   By: Genevie Ann M.D.   On: 09/13/2021 04:53   DG Chest 2 View  Result Date: 09/10/2021 CLINICAL DATA:  Atrial fibrillation EXAM: CHEST - 2 VIEW COMPARISON:  None. FINDINGS: Heart is borderline in size. Lungs clear. No effusions or edema. No acute bony abnormality.  IMPRESSION: No active cardiopulmonary disease. Electronically Signed   By: Rolm Baptise M.D.   On: 09/10/2021 18:28   CT HEAD WO CONTRAST (5MM)  Result Date: 09/10/2021 CLINICAL DATA:  Transient ischemic attack EXAM: CT HEAD WITHOUT CONTRAST TECHNIQUE: Contiguous axial images were obtained from the base of the skull through the vertex without intravenous contrast. 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 This is unchanged from prior examination. 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 No evidence of acute intracranial hemorrhage or infarct. No abnormal mass effect or midline shift. No abnormal intra or extra-axial mass  lesion. Ventricular size is normal. Vascular: No asymmetric hyperdense vasculature at the skull base. Skull: No acute fracture Sinuses/Orbits: Paranasal sinuses are clear. Ocular lenses have been removed. Orbits are otherwise unremarkable. Other: Mastoid air cells and middle ear cavities are clear. IMPRESSION: No acute intracranial abnormality. 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 Electronically Signed   By: Fidela Salisbury M.D.   On: 09/10/2021 20:44    EKG: AF. Poor R wave progression. Left axis deviation. Non-specific T wave abnormalities.   - Axis is different than prior (rightward)  NM spect 03/2019- No ischemia  Echo 11/2017- - Left ventricle: The cavity size was normal. There was mild    concentric hypertrophy. Systolic function was normal. The    estimated ejection fraction was in the range of 60% to 65%. Wall    motion was normal; there were no regional wall motion    abnormalities. The  study is not technically sufficient to allow    evaluation of LV diastolic function.  - Left atrium: The atrium was mildly dilated.  - Right ventricle: Systolic function was normal.  - Pulmonary arteries: Systolic pressure was within the normal    range.   ASSESSMENT AND PLAN:   Kayla Vaughn is a 80 year old female with history of permanent A. fib, TIA (11/2017), hypertension, hyperlipidemia who presents to the emergency department for evaluation of palpitations and chest pressure.  # Chest pressure She describes somewhat atypical chest pain and that it started after feeling as though she had blacked out and then gradually developed chest pressure over several minutes.  She is now chest pain-free.  Her high-sensitivity troponin is mildly elevated at 28 and is not rising or falling.  Her EKG shows no acute ischemia.  Given her risk factors for coronary artery disease I think it is reasonable to repeat an ischemic evaluation at this time while inpatient. -Plan for nuclear medicine stress test tomorrow -N.p.o. at midnight, no caffeine after midnight. - Please give aspirin 324 mg now if not given previously. Then 81 mg daily -No indication for heparin at this time - Echocardiogram complete  # Dizziness, presyncope She has had 3 episodes in which she felt like she was going to pass out over the last several weeks.  It does not sound as though she has had any true syncope.  We will complete an ischemic evaluation as above, but ultimately she will likely need a monitor at discharge to assess for significant bradycardia arrhythmias.  # Permanent AF Follows with [facility] electrophysiology.  She reportedly had been doing well without any rate controlling medicines for some time. -CHA2DS2-VASc = 6 -Continue Eliquis 5 mg twice daily -Not on rate control medications at baseline  Signed: Andrez Grime MD 09/13/2021, 12:30 PM

## 2021-09-14 ENCOUNTER — Observation Stay: Payer: Medicare HMO

## 2021-09-14 DIAGNOSIS — F419 Anxiety disorder, unspecified: Secondary | ICD-10-CM | POA: Diagnosis not present

## 2021-09-14 DIAGNOSIS — N179 Acute kidney failure, unspecified: Secondary | ICD-10-CM

## 2021-09-14 DIAGNOSIS — R0789 Other chest pain: Secondary | ICD-10-CM

## 2021-09-14 DIAGNOSIS — I482 Chronic atrial fibrillation, unspecified: Secondary | ICD-10-CM | POA: Diagnosis not present

## 2021-09-14 LAB — BASIC METABOLIC PANEL
Anion gap: 6 (ref 5–15)
BUN: 19 mg/dL (ref 8–23)
CO2: 25 mmol/L (ref 22–32)
Calcium: 8.8 mg/dL — ABNORMAL LOW (ref 8.9–10.3)
Chloride: 110 mmol/L (ref 98–111)
Creatinine, Ser: 0.98 mg/dL (ref 0.44–1.00)
GFR, Estimated: 59 mL/min — ABNORMAL LOW (ref >60)
Glucose, Bld: 100 mg/dL — ABNORMAL HIGH (ref 70–99)
Potassium: 3.9 mmol/L (ref 3.5–5.1)
Sodium: 141 mmol/L (ref 135–145)

## 2021-09-14 LAB — NM MYOCAR MULTI W/SPECT W/WALL MOTION / EF
Estimated workload: 1
Exercise duration (sec): 53 s
LV dias vol: 57 mL (ref 46–106)
LV sys vol: 21 mL
MPHR: 142 {beats}/min
Nuc Stress EF: 63 %
Peak HR: 83 {beats}/min
Percent HR: 58 %
Rest HR: 68 {beats}/min
Rest Nuclear Isotope Dose: 10.1 mCi
SDS: 5
SRS: 2
SSS: 6
ST Depression (mm): 0 mm
Stress Nuclear Isotope Dose: 30 mCi
TID: 1.02

## 2021-09-14 LAB — LIPID PANEL
Cholesterol: 183 mg/dL (ref 0–200)
HDL: 43 mg/dL (ref >40)
LDL Cholesterol: 126 mg/dL — ABNORMAL HIGH (ref 0–99)
Total CHOL/HDL Ratio: 4.3 RATIO
Triglycerides: 72 mg/dL (ref <150)
VLDL: 14 mg/dL (ref 0–40)

## 2021-09-14 LAB — CBC
HCT: 38.8 % (ref 36.0–46.0)
Hemoglobin: 12.5 g/dL (ref 12.0–15.0)
MCH: 28.3 pg (ref 26.0–34.0)
MCHC: 32.2 g/dL (ref 30.0–36.0)
MCV: 88 fL (ref 80.0–100.0)
Platelets: 201 10*3/uL (ref 150–400)
RBC: 4.41 MIL/uL (ref 3.87–5.11)
RDW: 14.9 % (ref 11.5–15.5)
WBC: 2.6 10*3/uL — ABNORMAL LOW (ref 4.0–10.5)
nRBC: 0 % (ref 0.0–0.2)

## 2021-09-14 LAB — HEMOGLOBIN A1C
Hgb A1c MFr Bld: 5.5 % (ref 4.8–5.6)
Mean Plasma Glucose: 111.15 mg/dL

## 2021-09-14 MED ORDER — ASPIRIN 81 MG PO TBEC: 81.0000 mg | DELAYED_RELEASE_TABLET | Freq: Every day | ORAL | 11 refills | Status: DC

## 2021-09-14 MED ORDER — ATORVASTATIN CALCIUM 40 MG PO TABS: 40.0000 mg | ORAL_TABLET | Freq: Every day | ORAL | 1 refills | Status: DC

## 2021-09-14 MED ORDER — TECHNETIUM TC 99M TETROFOSMIN IV KIT
10.0000 | PACK | Freq: Once | INTRAVENOUS | Status: AC | PRN
  Administered 2021-09-14: 10.13 via INTRAVENOUS

## 2021-09-14 MED ORDER — TECHNETIUM TC 99M TETROFOSMIN IV KIT
30.0100 | PACK | Freq: Once | INTRAVENOUS | Status: AC | PRN
  Administered 2021-09-14: 30.01 via INTRAVENOUS

## 2021-09-14 MED ORDER — REGADENOSON 0.4 MG/5ML IV SOLN
0.4000 mg | Freq: Once | INTRAVENOUS | Status: AC
  Administered 2021-09-14: 0.4 mg via INTRAVENOUS

## 2021-09-14 MED ORDER — NITROGLYCERIN 0.4 MG SL SUBL: 0.4000 mg | SUBLINGUAL_TABLET | SUBLINGUAL | 12 refills | Status: DC | PRN

## 2021-09-14 NOTE — Evaluation (Signed)
Physical Therapy Evaluation Patient Details Name: Kayla Vaughn MRN: [id number] DOB: 09/30/1941 Today's Date: 09/14/2021  History of Present Illness  Kayla Vaughn is a 80 y.o. female  with h/o HTN, HLD, here with chest pain, palpitations. Pt reports that she awoke this morning with chest pain, pressure, and SOB. Felt fine going to bed other than being somewhat weak/tired throughout the day yesterday. Awoke this AM with dull, pressure like CP, lightheadedness, nausea, and SOB. Previous ED visit on 11/17 for similar work up.    Clinical Impression  Pt admitted with above diagnosis. Pt received supine in bed asleep easily awoken to voice. Agreeable to PT intervention. Able to report PLOF, home lay out, DME, and assist available without difficulty. Repotrs being mod-I with all household and community ADL's. Reports history of back, hip, and knee pain at baseline and does intermittently wear a back and knee brace for pain modulation. Transport awaiting for pt pick up for stress test so unable to fully assess mobility today. Pt was mod-I for bed mobility and supervision to stand and amb to bathroom without AD. Minor balance deficits observed with intermittent touching of walls for support. Assisted to bathroom where pt voided bladder independently. Pt returned to bed with supervision left in hands of transport. Reports near baseline mobility but would like to stay on PT caseload to progress OOB mobility due to her LE and back pain. Will benefit from [facility] PT due to observed balance deficits. Pt currently with functional limitations due to the deficits listed below (see PT Problem List). Pt will benefit from skilled PT to increase their independence and safety with mobility to allow discharge to the venue listed below.     Recommendations for follow up therapy are one component of a multi-disciplinary discharge planning process, led by the attending physician.  Recommendations may be updated based on patient  status, additional functional criteria and insurance authorization.  Follow Up Recommendations Home health PT    Assistance Recommended at Discharge PRN  Functional Status Assessment Patient has had a recent decline in their functional status and demonstrates the ability to make significant improvements in function in a reasonable and predictable amount of time.  Equipment Recommendations  None recommended by PT    Recommendations for Other Services       Precautions / Restrictions Precautions Precautions: Fall Precaution Comments: Does state using knee brace and back brace at baseline for pain modulation but does not need to have them donned. Not at hospital. Restrictions Weight Bearing Restrictions: No      Mobility  Bed Mobility Overal bed mobility: Modified Independent               Patient Response: Cooperative  Transfers Overall transfer level: Needs assistance Equipment used: None Transfers: Sit to/from Stand Sit to Stand: Supervision                Ambulation/Gait Ambulation/Gait assistance: Supervision Gait Distance (Feet): 20 Feet   Gait Pattern/deviations: Step-to pattern;Narrow base of support          Stairs            Wheelchair Mobility    Modified Rankin (Stroke Patients Only)       Balance Overall balance assessment: Needs assistance Sitting-balance support: No upper extremity supported;Feet supported;Feet unsupported Sitting balance-Leahy Scale: Good Sitting balance - Comments: Able to don socks in sitting EOB     Standing balance-Leahy Scale: Fair Standing balance comment: Able to stand without AD with no external  support                             Pertinent Vitals/Pain Pain Assessment: Faces Faces Pain Scale: Hurts a little bit Pain Location: low back Pain Descriptors / Indicators: Aching;Discomfort Pain Intervention(s): Limited activity within patient's tolerance;Monitored during session    [place] expects to be discharged to:: Private residence Living Arrangements: Alone Available Help at Discharge: Family;Available PRN/intermittently Type of Home: Mobile home Home Access: Stairs to enter Entrance Stairs-Rails: Can reach both Entrance Stairs-Number of Steps: 4-5   Home Layout: One level Home Equipment: Conservation officer, nature (2 wheels)      Prior Function Prior Level of Function : Independent/Modified Independent                     Hand Dominance        Extremity/Trunk Assessment   Upper Extremity Assessment Upper Extremity Assessment: Overall WFL for tasks assessed    Lower Extremity Assessment Lower Extremity Assessment: Overall WFL for tasks assessed    Cervical / Trunk Assessment Cervical / Trunk Assessment: Normal  Communication      Cognition Arousal/Alertness: Awake/alert Behavior During Therapy: WFL for tasks assessed/performed Overall Cognitive Status: Within Functional Limits for tasks assessed                                          General Comments      Exercises Other Exercises Other Exercises: Role of PT in acute setting   Assessment/Plan    PT Assessment Patient needs continued PT services  PT Problem List Decreased strength;Decreased activity tolerance;Decreased mobility       PT Treatment Interventions DME instruction;Therapeutic exercise;Gait training;Balance training;Stair training;Neuromuscular re-education;Functional mobility training;Therapeutic activities;Patient/family education    PT Goals (Current goals can be found in the Care Plan section)  Acute Rehab PT Goals Patient Stated Goal: go home PT Goal Formulation: With patient Time For Goal Achievement: 09/28/21 Potential to Achieve Goals: Good    Frequency Min 2X/week   Barriers to discharge   stairs to enter home    Co-evaluation               AM-PAC PT "6 Clicks" Mobility  Outcome Measure Help needed turning from  your back to your side while in a flat bed without using bedrails?: None Help needed moving from lying on your back to sitting on the side of a flat bed without using bedrails?: None Help needed moving to and from a bed to a chair (including a wheelchair)?: None Help needed standing up from a chair using your arms (e.g., wheelchair or bedside chair)?: None Help needed to walk in hospital room?: None Help needed climbing 3-5 steps with a railing? : A Little 6 Click Score: 23    End of Session   Activity Tolerance: Patient tolerated treatment well Patient left: in bed;Other (comment) (left in care of transporter) Nurse Communication: Mobility status PT Visit Diagnosis: Unsteadiness on feet (R26.81);Other abnormalities of gait and mobility (R26.89)    Time: [id number] PT Time Calculation (min) (ACUTE ONLY): 16 min   Charges:   PT Evaluation $PT Eval Low Complexity: 1 Low PT Treatments $Therapeutic Activity: 8-22 mins        Maanav Kassabian M. Fairly IV, PT, DPT Physical Therapist- [facility]  09/14/2021, 10:56  AM

## 2021-09-14 NOTE — Consult Note (Signed)
[facility]  CARDIOLOGY CONSULT NOTE  Patient ID: Kayla Vaughn MRN: [id number] DOB/AGE: 11/13/1940 80 y.o.  Admit date: 09/13/2021 Referring Physician Ivor Costa Primary Physician Renee Rival, NP Primary Cardiologist Rosebud Poles Reason for Consultation Chest pain  HPI:  Kayla Vaughn is a 80 year old female with history of permanent A. fib, TIA (11/2017), hypertension, hyperlipidemia who presents to the emergency department for evaluation of dizziness and chest pressure.  Interval: - No chest pain or shortness of breath.  - No episodes of feeling like she was going to black out.   Review of systems complete and found to be negative unless listed above     Past Medical History:  Diagnosis Date   Brain aneurysm 1996   Hyperlipemia    Hypertension    Motion sickness    boats, long car rides   Wears dentures    full upper    Past Surgical History:  Procedure Laterality Date   ABDOMINAL HYSTERECTOMY     BACK SURGERY     BRAIN SURGERY  1996   CATARACT EXTRACTION W/PHACO Right 02/06/2021   Procedure: CATARACT EXTRACTION PHACO AND INTRAOCULAR LENS PLACEMENT (IOC) RIGHT 5.43 01:08.1 8.0%;  Surgeon: Leandrew Koyanagi, MD;  Location: [place];  Service: Ophthalmology;  Laterality: Right;   CATARACT EXTRACTION W/PHACO Left 02/18/2021   Procedure: CATARACT EXTRACTION PHACO AND INTRAOCULAR LENS PLACEMENT (IOC) LEFT 8.25 01:13.2 11.2%;  Surgeon: Leandrew Koyanagi, MD;  Location: [place];  Service: Ophthalmology;  Laterality: Left;   CHOLECYSTECTOMY      Medications Prior to Admission  Medication Sig Dispense Refill Last Dose   amLODipine (NORVASC) 10 MG tablet Take 10 mg by mouth daily.   09/12/2021   apixaban (ELIQUIS) 5 MG TABS tablet Take 1 tablet (5 mg total) by mouth 2 (two) times daily. 60 tablet 0 09/12/2021   cholecalciferol (VITAMIN D) 1000 units tablet Take 5,000 Units by mouth daily.   09/12/2021   latanoprost (XALATAN) 0.005 %  ophthalmic solution 1 drop at bedtime.   09/12/2021   lisinopril (PRINIVIL,ZESTRIL) 20 MG tablet Take 20 mg by mouth daily.   09/12/2021   Multiple Vitamins-Minerals (WOMENS 50+ MULTI VITAMIN/MIN PO) Take by mouth.   09/12/2021   omega-3 acid ethyl esters (LOVAZA) 1 g capsule Take 1 g by mouth daily.   09/12/2021   ALPRAZolam (XANAX) 0.25 MG tablet Take 0.25 mg by mouth. Prn when traveling for panic attack per pt   prn at prn   Brimonidine Tartrate-Timolol (COMBIGAN OP) Apply to eye in the morning and at bedtime.      fluticasone (FLONASE) 50 MCG/ACT nasal spray Place 1 spray into both nostrils daily as needed for allergies or rhinitis.   prn at prn   HYDROcodone-acetaminophen (NORCO/VICODIN) 5-325 MG tablet One tablet every four hours for pain. (Patient not taking: Reported on 09/13/2021) 30 tablet 0 Not Taking    Social History   Socioeconomic History   Marital status: Legally Separated    Spouse name: Not on file   Number of children: Not on file   Years of education: Not on file   Highest education level: Not on file  Occupational History   Occupation: retired  Tobacco Use   Smoking status: Former    Types: Cigarettes    Quit date: 1994    Years since quitting: 28.9   Smokeless tobacco: Never  Vaping Use   Vaping Use: Never used  Substance and Sexual Activity   Alcohol use: No   Drug  use: No   Sexual activity: Not on file  Other Topics Concern   Not on file  Social History Narrative   Not on file   Social Determinants of Health   Financial Resource Strain: Not on file  Food Insecurity: Not on file  Transportation Needs: Not on file  Physical Activity: Not on file  Stress: Not on file  Social Connections: Not on file  Intimate Partner Violence: Not on file    Family History  Problem Relation Age of Onset   Breast cancer Maternal Grandmother       Review of systems complete and found to be negative unless listed above      PHYSICAL EXAM  General: Well  developed, well nourished, in no acute distress HEENT:  Normocephalic and atramatic Neck:  No JVD.  Lungs: Clear bilaterally to auscultation and percussion. Heart: Irregularly irregular . Normal S1 and S2 without gallops or murmurs.  Abdomen: Bowel sounds are positive, abdomen soft and non-tender  Msk:  Back normal, normal gait. Normal strength and tone for age. Extremities: No clubbing, cyanosis or edema.   Neuro: Alert and oriented X 3. Psych:  Good affect, responds appropriately  Labs:   Lab Results  Component Value Date   WBC 2.6 (L) 09/14/2021   HGB 12.5 09/14/2021   HCT 38.8 09/14/2021   MCV 88.0 09/14/2021   PLT 201 09/14/2021    Recent Labs  Lab 09/14/21 0711  NA 141  K 3.9  CL 110  CO2 25  BUN 19  CREATININE 0.98  CALCIUM 8.8*  GLUCOSE 100*    Lab Results  Component Value Date   TROPONINI <0.03 11/25/2017     Lab Results  Component Value Date   CHOL 183 09/14/2021   CHOL 183 11/26/2017   Lab Results  Component Value Date   HDL 43 09/14/2021   HDL 46 11/26/2017   Lab Results  Component Value Date   LDLCALC 126 (H) 09/14/2021   LDLCALC 119 (H) 11/26/2017   Lab Results  Component Value Date   TRIG 72 09/14/2021   TRIG 88 11/26/2017   Lab Results  Component Value Date   CHOLHDL 4.3 09/14/2021   CHOLHDL 4.0 11/26/2017   No results found for: LDLDIRECT    Radiology: DG Chest 2 View  Result Date: 09/13/2021 CLINICAL DATA:  80 year old female with chest tightness. Substernal chest discomfort that woke her from sleep. Shortness of breath. History of atrial fibrillation. EXAM: CHEST - 2 VIEW COMPARISON:  Chest radiographs 09/10/2021. FINDINGS: Stable cardiomegaly and mediastinal contours. Normal lung volumes. Visualized tracheal air column is within normal limits. Both lungs remain clear. No pneumothorax or pleural effusion. Chronic lower thoracic compression fractures. No acute osseous abnormality identified. Stable cholecystectomy clips. Negative  visible bowel gas pattern. IMPRESSION: Stable cardiomegaly. No acute cardiopulmonary abnormality. Electronically Signed   By: Genevie Ann M.D.   On: 09/13/2021 04:53   DG Chest 2 View  Result Date: 09/10/2021 CLINICAL DATA:  Atrial fibrillation EXAM: CHEST - 2 VIEW COMPARISON:  None. FINDINGS: Heart is borderline in size. Lungs clear. No effusions or edema. No acute bony abnormality. IMPRESSION: No active cardiopulmonary disease. Electronically Signed   By: Rolm Baptise M.D.   On: 09/10/2021 18:28   CT HEAD WO CONTRAST (5MM)  Result Date: 09/10/2021 CLINICAL DATA:  Transient ischemic attack EXAM: CT HEAD WITHOUT CONTRAST TECHNIQUE: Contiguous axial images were obtained from the base of the skull through the vertex without intravenous contrast. COMPARISON:  11/25/2017 FINDINGS: Brain:  Left suboccipital craniotomy has been performed with extensive encephalomalacia within the left cerebellar hemisphere and probable partial resection of the inferior aspect of the hemisphere and cerebellar vermis. 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 This is unchanged from prior examination. 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 No evidence of acute intracranial hemorrhage or infarct. No abnormal mass effect or midline shift. No abnormal intra or extra-axial mass lesion. Ventricular size is normal. Vascular: No asymmetric hyperdense vasculature at the skull base. Skull: No acute fracture Sinuses/Orbits: Paranasal sinuses are clear. Ocular lenses have been removed. Orbits are otherwise unremarkable. Other: Mastoid air cells and middle ear cavities are clear. IMPRESSION: No acute intracranial abnormality. 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 Electronically Signed   By: Helyn Numbers M.D.   On: 09/10/2021 20:44   ECHOCARDIOGRAM COMPLETE  Result Date: 09/13/2021    ECHOCARDIOGRAM  REPORT   Patient Name:   Kayla Vaughn Date of Exam: 09/13/2021 Medical Rec #:  [id number]       Height:       67.0 in Accession #:    [id number]      Weight:       188.0 lb Date of Birth:  30-Oct-1940        BSA:          1.970 m Patient Age:    80 years        BP:           143/86 mmHg Patient Gender: F               HR:           74 bpm. Exam Location:  [facility] Procedure: 2D Echo Indications:     Chest Pain R07.9  History:         Patient has prior history of Echocardiogram examinations, most                  recent 11/26/2017.  Sonographer:     Overton Mam RDCS Referring Phys:  Wynona Neat NIU Diagnosing Phys: Sena Slate IMPRESSIONS  1. Left ventricular ejection fraction, by estimation, is 60 to 65%. The left ventricle has normal function. The left ventricle has no regional wall motion abnormalities. There is moderate left ventricular hypertrophy. Left ventricular diastolic parameters are indeterminate.  2. Right ventricular systolic function was not well visualized. The right ventricular size is mildly enlarged.  3. Right atrial size was mildly dilated.  4. The mitral valve is normal in structure. No evidence of mitral valve regurgitation. No evidence of mitral stenosis. Moderate mitral annular calcification.  5. The aortic valve is normal in structure. Aortic valve regurgitation is not visualized. No aortic stenosis is present.  6. The inferior vena cava is normal in size with greater than 50% respiratory variability, suggesting right atrial pressure of 3 mmHg. FINDINGS  Left Ventricle: Left ventricular ejection fraction, by estimation, is 60 to 65%. The left ventricle has normal function. The left ventricle has no regional wall motion abnormalities. The left ventricular internal cavity size was normal in size. There is  moderate left ventricular hypertrophy. Left ventricular diastolic parameters are indeterminate. Right Ventricle: The right ventricular size is mildly enlarged. Right vetricular wall thickness  was not well visualized. Right ventricular systolic function was not well visualized. Left Atrium: Left atrial size was normal in size. Right Atrium: Right atrial size was mildly dilated.  Pericardium: There is no evidence of pericardial effusion. Mitral Valve: The mitral valve is normal in structure. Moderate mitral annular calcification. No evidence of mitral valve regurgitation. No evidence of mitral valve stenosis. Tricuspid Valve: The tricuspid valve is normal in structure. Tricuspid valve regurgitation is not demonstrated. Aortic Valve: The aortic valve is normal in structure. Aortic valve regurgitation is not visualized. No aortic stenosis is present. Aortic valve peak gradient measures 6.0 mmHg. Pulmonic Valve: The pulmonic valve was not well visualized. Pulmonic valve regurgitation is not visualized. No evidence of pulmonic stenosis. Aorta: The aortic root is normal in size and structure. Venous: The inferior vena cava is normal in size with greater than 50% respiratory variability, suggesting right atrial pressure of 3 mmHg. IAS/Shunts: The interatrial septum was not well visualized.  LEFT VENTRICLE PLAX 2D LVIDd:         3.90 cm   Diastology LVIDs:         2.60 cm   LV e' medial:    5.66 cm/s LV PW:         1.50 cm   LV E/e' medial:  17.4 LV IVS:        1.40 cm   LV e' lateral:   6.09 cm/s LVOT diam:     1.80 cm   LV E/e' lateral: 16.2 LV SV:         34 LV SV Index:   17 LVOT Area:     2.54 cm  LEFT ATRIUM           Index        RIGHT ATRIUM           Index LA diam:      4.20 cm 2.13 cm/m   RA Area:     23.30 cm LA Vol (A2C): 33.1 ml 16.80 ml/m  RA Volume:   75.90 ml  38.53 ml/m LA Vol (A4C): 52.6 ml 26.70 ml/m  AORTIC VALVE                 PULMONIC VALVE AV Area (Vmax): 1.63 cm     PV Vmax:       0.92 m/s AV Vmax:        122.00 cm/s  PV Peak grad:  3.4 mmHg AV Peak Grad:   6.0 mmHg LVOT Vmax:      78.30 cm/s LVOT Vmean:     48.700 cm/s LVOT VTI:       0.132 m  AORTA Ao Root diam: 3.10 cm Ao Asc  diam:  3.60 cm MITRAL VALVE MV Area (PHT): 4.63 cm    SHUNTS MV Decel Time: 164 msec    Systemic VTI:  0.13 m MV E velocity: 98.50 cm/s  Systemic Diam: 1.80 cm Donnelly Angelica Electronically signed by Donnelly Angelica Signature Date/Time: 09/13/2021/3:27:12 PM    Final     EKG: AF. Poor R wave progression. Left axis deviation. Non-specific T wave abnormalities.   - Axis is different than prior (rightward)  NM spect 03/2019- No ischemia  Echo 11/2017- - Left ventricle: The cavity size was normal. There was mild    concentric hypertrophy. Systolic function was normal. The    estimated ejection fraction was in the range of 60% to 65%. Wall    motion was normal; there were no regional wall motion    abnormalities. The study is not technically sufficient to allow    evaluation of LV diastolic function.  - Left atrium: The atrium was mildly dilated.  -  Right ventricle: Systolic function was normal.  - Pulmonary arteries: Systolic pressure was within the normal    range.   ASSESSMENT AND PLAN:   Kayla Vaughn is a 80 year old female with history of permanent A. fib, TIA (11/2017), hypertension, hyperlipidemia who presents to the emergency department for evaluation of palpitations and chest pressure.  # Chest pressure She describes somewhat atypical chest pain and that it started after feeling as though she had blacked out and then gradually developed chest pressure over several minutes.  She is now chest pain-free.  Her high-sensitivity troponin is mildly elevated at 28 and is not rising or falling.  Her EKG shows no acute ischemia.  Given her risk factors for coronary artery disease I think it is reasonable to repeat an ischemic evaluation at this time while inpatient. -NM stress test today. Final read pending appears to be fairly normal/low risk.  - Echo shows LVH with normal RV/LV function.  - Continue aspirin 81 mg daily  # Dizziness, presyncope She has had 3 episodes in which she felt like she was  going to pass out over the last several weeks.  It does not sound as though she has had any true syncope.  Ischemic evaluation is negative. We will place a 7 day monitor on the patient to evaluate for slow AF/ pauses as possible contributors. No evidence of high grade block on monitor during hospitalization.   # Permanent AF Follows with [facility] electrophysiology.  She reportedly had been doing well without any rate controlling medicines for some time. -CHA2DS2-VASc = 6 -Continue Eliquis 5 mg twice daily -Not on rate control medications at baseline  Signed: Andrez Grime MD 09/14/2021, 10:27 AM

## 2021-09-14 NOTE — Care Management Obs Status (Signed)
MEDICARE OBSERVATION STATUS NOTIFICATION   Patient Details  Name: NYHLA MOUNTJOY MRN: [id number] Date of Birth: 03-12-1941   Medicare Observation Status Notification Given:  Yes    Gildardo Griffes, LCSW 09/14/2021, 3:31 PM

## 2021-09-14 NOTE — Evaluation (Signed)
Occupational Therapy Evaluation Patient Details Name: ROCHEL PRIVETT MRN: [id number] DOB: May 21, 1941 Today's Date: 09/14/2021   History of Present Illness Braylen DERENDA GIDDINGS is a 80 y.o. female  with h/o HTN, HLD, here with chest pain, palpitations. Pt reports that she awoke this morning with chest pain, pressure, and SOB. Felt fine going to bed other than being somewhat weak/tired throughout the day yesterday. Awoke this AM with dull, pressure like CP, lightheadedness, nausea, and SOB. Previous ED visit on 11/17 for similar worry   Clinical Impression   Ms. Reza presents today with generalized weakness and mild dizziness. She lives alone in a single-story home, has PRN assistance from her children, drives, is active in community, reports going to local senior center for social activities and exercise at least 5 days/week. She states she has baseline pain in L knee, R hip, and lower back, and routinely wears a knee brace and a back brace. She reports 1 fall in previous 12 months, while getting out of bathtub. Today she is able to perform bed mobility, transfers, grooming, dressing, ambulation, therex in standing, all with IND-SUPV. She appears to be at baseline level of fxl mobility, denies having unmet OT needs. Therapist provided educ re: installing grab bars in bathroom, other falls prevention techniques. No further rehab services needed at this time.      Recommendations for follow up therapy are one component of a multi-disciplinary discharge planning process, led by the attending physician.  Recommendations may be updated based on patient status, additional functional criteria and insurance authorization.   Follow Up Recommendations  No OT follow up    Assistance Recommended at Discharge PRN  Functional Status Assessment  Patient has not had a recent decline in their functional status  Equipment Recommendations  Other (comment) (grab bar in tub/shower)    Recommendations for Other  Services       Precautions / Restrictions Precautions Precautions: Fall Precaution Comments: Mod fall risk. Reports using knee and back brace at baseline for pain mgmt, does not have these with her at the hospital. Restrictions Weight Bearing Restrictions: No      Mobility Bed Mobility Overal bed mobility: Independent                  Transfers Overall transfer level: Modified independent                        Balance Overall balance assessment: Modified Independent Sitting-balance support: No upper extremity supported;Feet supported;Feet unsupported Sitting balance-Leahy Scale: Good Sitting balance - Comments: Able to don socks in sitting EOB     Standing balance-Leahy Scale: Good Standing balance comment: No AD                           ADL either performed or assessed with clinical judgement   ADL Overall ADL's : Independent                                             Vision         Perception     Praxis      Pertinent Vitals/Pain Pain Assessment: No/denies pain     Hand Dominance     Extremity/Trunk Assessment Upper Extremity Assessment Upper Extremity Assessment: Overall WFL for tasks assessed   Lower Extremity Assessment Lower  Extremity Assessment: Overall WFL for tasks assessed   Cervical / Trunk Assessment Cervical / Trunk Assessment: Normal   Communication Communication Communication: No difficulties   Cognition Arousal/Alertness: Awake/alert Behavior During Therapy: WFL for tasks assessed/performed Overall Cognitive Status: Within Functional Limits for tasks assessed                                 General Comments: A&O x 4, very pleasant     General Comments       Exercises Other Exercises Other Exercises: Bed mobility, transfers, dressing, grooming, ambulation, therex in standing. Discussion re: falls prevention, DC recs   Shoulder Instructions      Home Living  Family/patient expects to be discharged to:: Private residence Living Arrangements: Alone Available Help at Discharge: Family;Available PRN/intermittently Type of Home: Mobile home Home Access: Stairs to enter Entrance Stairs-Number of Steps: 5 Entrance Stairs-Rails: Can reach both Home Layout: One level     Bathroom Shower/Tub: Tub/shower unit;Walk-in shower   Bathroom Toilet: Handicapped height Bathroom Accessibility: No   Home Equipment: Conservation officer, nature (2 wheels)          Prior Functioning/Environment Prior Level of Function : Independent/Modified Independent                        OT Problem List: Decreased strength;Decreased activity tolerance;Decreased knowledge of use of DME or AE      OT Treatment/Interventions:      OT Goals(Current goals can be found in the care plan section) Acute Rehab OT Goals Patient Stated Goal: to get back to senior center OT Goal Formulation: With patient Time For Goal Achievement: 09/28/21 Potential to Achieve Goals: Good  OT Frequency:     Barriers to D/C:            Co-evaluation              AM-PAC OT "6 Clicks" Daily Activity     Outcome Measure Help from another person eating meals?: None Help from another person taking care of personal grooming?: None Help from another person toileting, which includes using toliet, bedpan, or urinal?: None Help from another person bathing (including washing, rinsing, drying)?: None Help from another person to put on and taking off regular upper body clothing?: None Help from another person to put on and taking off regular lower body clothing?: None 6 Click Score: 24   End of Session    Activity Tolerance: Patient tolerated treatment well Patient left: in bed;with call bell/phone within reach;with bed alarm set;with family/visitor present  OT Visit Diagnosis: Muscle weakness (generalized) (M62.81);Unsteadiness on feet (R26.81)                Time: [id number] OT Time  Calculation (min): 13 min Charges:  OT General Charges $OT Visit: 1 Visit OT Evaluation $OT Eval Low Complexity: 1 Low OT Treatments $Self Care/Home Management : 8-22 mins Josiah Lobo, PhD, MS, OTR/L 09/14/21, 4:15 PM

## 2021-09-14 NOTE — Discharge Summary (Signed)
[facility] NAME: Kayla Vaughn    MR#:  [id number]  DATE OF BIRTH:  04/30/1941  DATE OF ADMISSION:  09/13/2021 ADMITTING PHYSICIAN: Ivor Costa, MD  DATE OF DISCHARGE: 09/14/2021  PRIMARY CARE PHYSICIAN: Renee Rival, NP    ADMISSION DIAGNOSIS:  Atypical chest pain [R07.89] Near syncope [R55] Chest pain [R07.9]  DISCHARGE DIAGNOSIS:  chest pressure/  pain atypical pre-syncope permanent atrial fibrillation SECONDARY DIAGNOSIS:   Past Medical History:  Diagnosis Date  . Brain aneurysm 1996  . Hyperlipemia   . Hypertension   . Motion sickness    boats, long car rides  . Wears dentures    full upper    HOSPITAL COURSE:  Kayla Vaughn is a 80 y.o. 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   Chest pain and elevated trop: trop 28 --> 29. -- Dr. Corky Sox of card is consulted --pt's Myoview stress test is normal (prelim report per Dr Corky Sox) - aspirin, lipitor  -patient denies any chest pain today. -- Cardiology recommends Holter monitor for home. Will be placed by their office. Patient will follow-up as outpatient  HLD: -started lipitor 40 mg daily   Hypertension -IV hydralazine as needed -Amlodipine, lisinopril   Anxiety -Continue home as needed Xanax   Atrial fibrillation, chronic ([place]): Heart rate 75.  Patient is not on nodal blockers -Continue Eliquis -- heart rate 58 to 72   AKI (acute kidney injury) ([place]): Mild, GFR 51, creatinine 1.10, BUN 18.  Likely due to dehydration and continuation of her lisinopril. -IV fluid: 75 cc/h of normal saline-- creatinine .9 -- will resume lisinopril at discharge   Lightheadedness: Etiology is not clear.  CT of the head was negative on 11/17.  No focal neurodeficit on physical  examination. -- Patient ambulated with physical therapy recommends home health PT. Patient declines home health services.  Discharge plan was discussed with patient and son at bedside. CONSULTS OBTAINED:    DRUG ALLERGIES:   Allergies  Allergen Reactions  . Elemental Sulfur   . Sulfur Other (See Comments)    DISCHARGE MEDICATIONS:   Allergies as of 09/14/2021       Reactions   Elemental Sulfur    Sulfur Other (See Comments)        Medication List     STOP taking these medications    HYDROcodone-acetaminophen 5-325 MG tablet Commonly known as: NORCO/VICODIN       TAKE these medications    ALPRAZolam 0.25 MG tablet Commonly known as: XANAX Take 0.25 mg by mouth. Prn when traveling for panic attack per pt   amLODipine 10 MG tablet Commonly known as: NORVASC Take 10 mg by mouth daily.   apixaban 5 MG Tabs tablet Commonly known as: Eliquis Take 1 tablet (5 mg total) by mouth 2 (two) times daily.   aspirin 81 MG EC tablet Take 1 tablet (81 mg total) by mouth daily. Swallow whole. Start taking on: September 15, 2021   atorvastatin 40 MG tablet Commonly known as: LIPITOR Take 1 tablet (40 mg total) by mouth daily. Start taking on: September 15, 2021   cholecalciferol 1000 units tablet Commonly known as: VITAMIN D Take 5,000 Units by mouth daily.   COMBIGAN OP Apply to eye in the morning and at bedtime.   fluticasone  50 MCG/ACT nasal spray Commonly known as: FLONASE Place 1 spray into both nostrils daily as needed for allergies or rhinitis.   latanoprost 0.005 % ophthalmic solution Commonly known as: XALATAN 1 drop at bedtime.   lisinopril 20 MG tablet Commonly known as: ZESTRIL Take 20 mg by mouth daily.   nitroGLYCERIN 0.4 MG SL tablet Commonly known as: NITROSTAT Place 1 tablet (0.4 mg total) under the tongue every 5 (five) minutes as needed for chest pain.   omega-3 acid ethyl esters 1 g capsule Commonly known as: LOVAZA Take 1 g by mouth  daily.   WOMENS 50+ MULTI VITAMIN/MIN PO Take by mouth.        If you experience worsening of your admission symptoms, develop shortness of breath, life threatening emergency, suicidal or homicidal thoughts you must seek medical attention immediately by calling 911 or calling your MD immediately  if symptoms less severe.  You Must read complete instructions/literature along with all the possible adverse reactions/side effects for all the Medicines you take and that have been prescribed to you. Take any new Medicines after you have completely understood and accept all the possible adverse reactions/side effects.   Please note  You were cared for by a hospitalist during your hospital stay. If you have any questions about your discharge medications or the care you received while you were in the hospital after you are discharged, you can call the unit and asked to speak with the hospitalist on call if the hospitalist that took care of you is not available. Once you are discharged, your primary care physician will handle any further medical issues. Please note that NO REFILLS for any discharge medications will be authorized once you are discharged, as it is imperative that you return to your primary care physician (or establish a relationship with a primary care physician if you do not have one) for your aftercare needs so that they can reassess your need for medications and monitor your lab values. Today   SUBJECTIVE  patient eating fries and sausage biscuit from [employer] Had stress test earlier. Denies any chest pain or dizziness.  Son at bedside.   VITAL SIGNS:  Blood pressure 124/81, pulse 75, temperature 97.8 F (36.6 C), resp. rate 20, height 5\' 7"  (1.702 m), weight 88 kg, SpO2 100 %.  I/O:   Intake/Output Summary (Last 24 hours) at 09/14/2021 1542 Last data filed at 09/14/2021 1500 Gross per 24 hour  Intake 120 ml  Output --  Net 120 ml    PHYSICAL EXAMINATION:  GENERAL:   80 y.o.-year-old patient lying in the bed with no acute distress.  LUNGS: Normal breath sounds bilaterally, no wheezing, rales,rhonchi or crepitation. No use of accessory muscles of respiration.  CARDIOVASCULAR: S1, S2 normal. No murmurs, rubs, or gallops.  ABDOMEN: Soft, non-tender, non-distended. Bowel sounds present. No organomegaly or mass.  EXTREMITIES: No pedal edema, cyanosis, or clubbing.  NEUROLOGIC: non-focal PSYCHIATRIC:  patient is alert and oriented x 3.  SKIN: No obvious rash, lesion, or ulcer.   DATA REVIEW:   CBC  Recent Labs  Lab 09/14/21 0711  WBC 2.6*  HGB 12.5  HCT 38.8  PLT 201    Chemistries  Recent Labs  Lab 09/14/21 0711  NA 141  K 3.9  CL 110  CO2 25  GLUCOSE 100*  BUN 19  CREATININE 0.98  CALCIUM 8.8*    Microbiology Results   Recent Results (from the past 240 hour(s))  Resp Panel by RT-PCR (Flu A&B, Covid)  Nasopharyngeal Swab     Status: None   Collection Time: 09/13/21 11:00 AM   Specimen: Nasopharyngeal Swab; Nasopharyngeal(NP) swabs in vial transport medium  Result Value Ref Range Status   SARS Coronavirus 2 by RT PCR NEGATIVE NEGATIVE Final    Comment: (NOTE) SARS-CoV-2 target nucleic acids are NOT DETECTED.  The SARS-CoV-2 RNA is generally detectable in upper respiratory specimens during the acute phase of infection. The lowest concentration of SARS-CoV-2 viral copies this assay can detect is 138 copies/mL. A negative result does not preclude SARS-Cov-2 infection and should not be used as the sole basis for treatment or other patient management decisions. A negative result may occur with  improper specimen collection/handling, submission of specimen other than nasopharyngeal swab, presence of viral mutation(s) within the areas targeted by this assay, and inadequate number of viral copies(<138 copies/mL). A negative result must be combined with clinical observations, patient history, and epidemiological information. The expected  result is Negative.  Fact Sheet for Patients:  EntrepreneurPulse.com.au  Fact Sheet for Healthcare Providers:  IncredibleEmployment.be  This test is no t yet approved or cleared by the [place] FDA and  has been authorized for detection and/or diagnosis of SARS-CoV-2 by FDA under an Emergency Use Authorization (EUA). This EUA will remain  in effect (meaning this test can be used) for the duration of the COVID-19 declaration under Section 564(b)(1) of the Act, 21 U.S.C.section 360bbb-3(b)(1), unless the authorization is terminated  or revoked sooner.       Influenza A by PCR NEGATIVE NEGATIVE Final   Influenza B by PCR NEGATIVE NEGATIVE Final    Comment: (NOTE) The Xpert Xpress SARS-CoV-2/FLU/RSV plus assay is intended as an aid in the diagnosis of influenza from Nasopharyngeal swab specimens and should not be used as a sole basis for treatment. Nasal washings and aspirates are unacceptable for Xpert Xpress SARS-CoV-2/FLU/RSV testing.  Fact Sheet for Patients: EntrepreneurPulse.com.au  Fact Sheet for Healthcare Providers: IncredibleEmployment.be  This test is not yet approved or cleared by the [place] FDA and has been authorized for detection and/or diagnosis of SARS-CoV-2 by FDA under an Emergency Use Authorization (EUA). This EUA will remain in effect (meaning this test can be used) for the duration of the COVID-19 declaration under Section 564(b)(1) of the Act, 21 U.S.C. section 360bbb-3(b)(1), unless the authorization is terminated or revoked.  Performed at [facility], [address] [place], [place] [address]     RADIOLOGY:  DG Chest 2 View  Result Date: 09/13/2021 CLINICAL DATA:  80 year old female with chest tightness. Substernal chest discomfort that woke her from sleep. Shortness of breath. History of atrial fibrillation. EXAM: CHEST - 2 VIEW COMPARISON:  Chest  radiographs 09/10/2021. FINDINGS: Stable cardiomegaly and mediastinal contours. Normal lung volumes. Visualized tracheal air column is within normal limits. Both lungs remain clear. No pneumothorax or pleural effusion. Chronic lower thoracic compression fractures. No acute osseous abnormality identified. Stable cholecystectomy clips. Negative visible bowel gas pattern. IMPRESSION: Stable cardiomegaly. No acute cardiopulmonary abnormality. Electronically Signed   By: Genevie Ann M.D.   On: 09/13/2021 04:53   ECHOCARDIOGRAM COMPLETE  Result Date: 09/13/2021    ECHOCARDIOGRAM REPORT   Patient Name:   Kayla Vaughn Date of Exam: 09/13/2021 Medical Rec #:  [id number]       Height:       67.0 in Accession #:    [id number]      Weight:       188.0 lb Date of Birth:  1941/10/10  BSA:          1.970 m Patient Age:    80 years        BP:           143/86 mmHg Patient Gender: F               HR:           74 bpm. Exam Location:  [facility] Procedure: 2D Echo Indications:     Chest Pain R07.9  History:         Patient has prior history of Echocardiogram examinations, most                  recent 11/26/2017.  Sonographer:     Overton Mamikeshia Johnson RDCS Referring Phys:  Wynona [id number] XILIN NIU Diagnosing Phys: Sena Slateyan Orgel IMPRESSIONS  1. Left ventricular ejection fraction, by estimation, is 60 to 65%. The left ventricle has normal function. The left ventricle has no regional wall motion abnormalities. There is moderate left ventricular hypertrophy. Left ventricular diastolic parameters are indeterminate.  2. Right ventricular systolic function was not well visualized. The right ventricular size is mildly enlarged.  3. Right atrial size was mildly dilated.  4. The mitral valve is normal in structure. No evidence of mitral valve regurgitation. No evidence of mitral stenosis. Moderate mitral annular calcification.  5. The aortic valve is normal in structure. Aortic valve regurgitation is not visualized. No aortic stenosis is present.  6. The  inferior vena cava is normal in size with greater than 50% respiratory variability, suggesting right atrial pressure of 3 mmHg. FINDINGS  Left Ventricle: Left ventricular ejection fraction, by estimation, is 60 to 65%. The left ventricle has normal function. The left ventricle has no regional wall motion abnormalities. The left ventricular internal cavity size was normal in size. There is  moderate left ventricular hypertrophy. Left ventricular diastolic parameters are indeterminate. Right Ventricle: The right ventricular size is mildly enlarged. Right vetricular wall thickness was not well visualized. Right ventricular systolic function was not well visualized. Left Atrium: Left atrial size was normal in size. Right Atrium: Right atrial size was mildly dilated. Pericardium: There is no evidence of pericardial effusion. Mitral Valve: The mitral valve is normal in structure. Moderate mitral annular calcification. No evidence of mitral valve regurgitation. No evidence of mitral valve stenosis. Tricuspid Valve: The tricuspid valve is normal in structure. Tricuspid valve regurgitation is not demonstrated. Aortic Valve: The aortic valve is normal in structure. Aortic valve regurgitation is not visualized. No aortic stenosis is present. Aortic valve peak gradient measures 6.0 mmHg. Pulmonic Valve: The pulmonic valve was not well visualized. Pulmonic valve regurgitation is not visualized. No evidence of pulmonic stenosis. Aorta: The aortic root is normal in size and structure. Venous: The inferior vena cava is normal in size with greater than 50% respiratory variability, suggesting right atrial pressure of 3 mmHg. IAS/Shunts: The interatrial septum was not well visualized.  LEFT VENTRICLE PLAX 2D LVIDd:         3.90 cm   Diastology LVIDs:         2.60 cm   LV e' medial:    5.66 cm/s LV PW:         1.50 cm   LV E/e' medial:  17.4 LV IVS:        1.40 cm   LV e' lateral:   6.09 cm/s LVOT diam:     1.80 cm   LV E/e' lateral:  16.2 LV SV:  34 LV SV Index:   17 LVOT Area:     2.54 cm  LEFT ATRIUM           Index        RIGHT ATRIUM           Index LA diam:      4.20 cm 2.13 cm/m   RA Area:     23.30 cm LA Vol (A2C): 33.1 ml 16.80 ml/m  RA Volume:   75.90 ml  38.53 ml/m LA Vol (A4C): 52.6 ml 26.70 ml/m  AORTIC VALVE                 PULMONIC VALVE AV Area (Vmax): 1.63 cm     PV Vmax:       0.92 m/s AV Vmax:        122.00 cm/s  PV Peak grad:  3.4 mmHg AV Peak Grad:   6.0 mmHg LVOT Vmax:      78.30 cm/s LVOT Vmean:     48.700 cm/s LVOT VTI:       0.132 m  AORTA Ao Root diam: 3.10 cm Ao Asc diam:  3.60 cm MITRAL VALVE MV Area (PHT): 4.63 cm    SHUNTS MV Decel Time: 164 msec    Systemic VTI:  0.13 m MV E velocity: 98.50 cm/s  Systemic Diam: 1.80 cm Donnelly Angelica Electronically signed by Donnelly Angelica Signature Date/Time: 09/13/2021/3:27:12 PM    Final      CODE STATUS:     Code Status Orders  (From admission, onward)           Start     Ordered   09/13/21 1021  Full code  Continuous        09/13/21 1021           Code Status History     Date Active Date Inactive Code Status Order ID Comments User Context   11/26/2017 0225 11/27/2017 1437 Full Code [id number]  Saundra Shelling, MD Inpatient        TOTAL TIME TAKING CARE OF THIS PATIENT: 40 minutes.    Fritzi Mandes M.D  [facility]    CC: Primary care physician; Renee Rival, NP

## 2021-09-14 NOTE — TOC Initial Note (Signed)
Transition of Care [facility]) - Initial/Assessment Note    Patient Details  Name: Kayla Vaughn MRN: [id number] Date of Birth: 1941/09/16  [facility] CM/SW Contact:    Gildardo Griffes, LCSW Phone Number: 09/14/2021, 3:33 PM  Clinical Narrative:                  CSW spoke with patient regarding home health recommendations, she declines at this time. Patient reports having a walker at home and identifies no DME needs. She did inquire as to life alert, CSW provided patient with phone number for Med Alert at [phone].   No other discharge needs identified at this time.   Expected Discharge Plan: Home/Self Care Barriers to Discharge: Continued Medical Work up   Patient Goals and CMS Choice Patient states their goals for this hospitalization and ongoing recovery are:: to go home CMS Medicare.gov Compare Post Acute Care list provided to:: Patient Choice offered to / list presented to : Patient  Expected Discharge Plan and Services Expected Discharge Plan: Home/Self Care       Living arrangements for the past 2 months: Single Family Home                                      Prior Living Arrangements/Services Living arrangements for the past 2 months: Single Family Home Lives with:: Self                   Activities of Daily Living Home Assistive Devices/Equipment: None ADL Screening (condition at time of admission) Patient's cognitive ability adequate to safely complete daily activities?: Yes Is the patient deaf or have difficulty hearing?: No Does the patient have difficulty seeing, even when wearing glasses/contacts?: No Does the patient have difficulty concentrating, remembering, or making decisions?: No Patient able to express need for assistance with ADLs?: Yes Does the patient have difficulty dressing or bathing?: No Independently performs ADLs?: Yes (appropriate for developmental age) Does the patient have difficulty walking or climbing  stairs?: No Weakness of Legs: None Weakness of Arms/Hands: None  Permission Sought/Granted                  Emotional Assessment       Orientation: : Oriented to Self, Oriented to Place, Oriented to  Time, Oriented to Situation Alcohol / Substance Use: Not Applicable Psych Involvement: No (comment)  Admission diagnosis:  Atypical chest pain [R07.89] Near syncope [R55] Chest pain [R07.9] Patient Active Problem List   Diagnosis Date Noted   Chest pain 09/13/2021   Hyperlipemia    Hypertension    HLD (hyperlipidemia)    Anxiety    Atrial fibrillation, chronic ([facility])    AKI (acute kidney injury) ([facility])    Lightheadedness    TIA (transient ischemic attack) 11/26/2017   PCP:  Erasmo Downer, NP Pharmacy:   [facility] [phone] [place], [place] - [address] [place] [address] Phone: [phone] Fax: [phone]     Social Determinants of Health (SDOH) Interventions    Readmission Risk Interventions No flowsheet data found.

## 2021-09-14 NOTE — Plan of Care (Signed)

## 2021-09-14 NOTE — Progress Notes (Signed)
CSW attempted to see patient to set up with home health, patient currently not in room at stress test.   CSW will follow up when patient available.   [place], [place] [phone]

## 2021-09-14 NOTE — Discharge Instructions (Signed)
Pt to weatr the Heart monitor as instructed

## 2021-10-05 ENCOUNTER — Emergency Department
Admission: EM | Admit: 2021-10-05 | Discharge: 2021-10-05 | Disposition: A | Discharge status: Home / Self Care | Payer: Medicare HMO | Attending: Emergency Medicine | Admitting: Emergency Medicine

## 2021-10-05 ENCOUNTER — Emergency Department: Payer: Medicare HMO

## 2021-10-05 ENCOUNTER — Other Ambulatory Visit: Payer: Self-pay

## 2021-10-05 DIAGNOSIS — R0602 Shortness of breath: Secondary | ICD-10-CM | POA: Insufficient documentation

## 2021-10-05 DIAGNOSIS — R0789 Other chest pain: Secondary | ICD-10-CM | POA: Insufficient documentation

## 2021-10-05 DIAGNOSIS — Z5321 Procedure and treatment not carried out due to patient leaving prior to being seen by health care provider: Secondary | ICD-10-CM | POA: Insufficient documentation

## 2021-10-05 LAB — BASIC METABOLIC PANEL
Anion gap: 8 (ref 5–15)
BUN: 19 mg/dL (ref 8–23)
CO2: 28 mmol/L (ref 22–32)
Calcium: 10.2 mg/dL (ref 8.9–10.3)
Chloride: 105 mmol/L (ref 98–111)
Creatinine, Ser: 0.96 mg/dL (ref 0.44–1.00)
GFR, Estimated: >60 mL/min (ref >60)
Glucose, Bld: 105 mg/dL — ABNORMAL HIGH (ref 70–99)
Potassium: 4.2 mmol/L (ref 3.5–5.1)
Sodium: 141 mmol/L (ref 135–145)

## 2021-10-05 LAB — CBC
HCT: 44.9 % (ref 36.0–46.0)
Hemoglobin: 14.5 g/dL (ref 12.0–15.0)
MCH: 28.6 pg (ref 26.0–34.0)
MCHC: 32.3 g/dL (ref 30.0–36.0)
MCV: 88.6 fL (ref 80.0–100.0)
Platelets: 269 10*3/uL (ref 150–400)
RBC: 5.07 MIL/uL (ref 3.87–5.11)
RDW: 14.6 % (ref 11.5–15.5)
WBC: 3.7 10*3/uL — ABNORMAL LOW (ref 4.0–10.5)
nRBC: 0 % (ref 0.0–0.2)

## 2021-10-05 LAB — TROPONIN I (HIGH SENSITIVITY)
Troponin I (High Sensitivity): 25 ng/L — ABNORMAL HIGH
Troponin I (High Sensitivity): 26 ng/L — ABNORMAL HIGH (ref <18)

## 2021-10-05 NOTE — ED Triage Notes (Signed)
Pt states she was woke out of her sleep this morning with chest tightness and SOB. Denies any discomfort now.

## 2021-10-05 NOTE — ED Provider Notes (Signed)
  Emergency Medicine Provider Triage Evaluation Note  Kayla Vaughn , a 80 y.o.female,  was evaluated in triage.  Pt complains of chest pain/shortness of breath.  Patient states that this woke her from her sleep this morning.  She went to clinic today where the staff said that she was currently in atrial fibrillation.  She has had multiple episodes of atrial fibrillation over the past month or so.  She states that she currently feels well now.  Denies fever/chills, abdominal pain, back pain, urinary symptoms, cough/congestion.   Review of Systems  Positive: Chest pain, shortness of breath Negative: Denies fever, abdominal pain vomiting  Physical Exam   Vitals:   10/05/21 1529 10/05/21 1530  BP:  (!) 161/98  Pulse: 79   Resp: 18   Temp: 97.8 F (36.6 C)   SpO2: 98%    Gen:   Awake, no distress   Resp:  Normal effort  MSK:   Moves extremities without difficulty  Other:    Medical Decision Making  Given the patient's initial medical screening exam, the following diagnostic evaluation has been ordered. The patient will be placed in the appropriate treatment space, once one is available, to complete the evaluation and treatment. I have discussed the plan of care with the patient and I have advised the patient that an ED physician or mid-level practitioner will reevaluate their condition after the test results have been received, as the results may give them additional insight into the type of treatment they may need.    Diagnostics: EKG, labs, CXR.  Treatments: none immediately   Varney Daily, [place] 10/05/21 1535    Phineas Semen, MD 10/05/21 2020

## 2022-05-25 ENCOUNTER — Ambulatory Visit (INDEPENDENT_AMBULATORY_CARE_PROVIDER_SITE_OTHER): Payer: Medicare HMO | Admitting: Family Medicine

## 2022-05-25 ENCOUNTER — Encounter: Payer: Self-pay | Admitting: Family Medicine

## 2022-05-25 VITALS — BP 113/78 | HR 69 | Ht 67.0 in | Wt 190.0 lb

## 2022-05-25 DIAGNOSIS — Z1159 Encounter for screening for other viral diseases: Secondary | ICD-10-CM

## 2022-05-25 DIAGNOSIS — M545 Low back pain, unspecified: Secondary | ICD-10-CM | POA: Insufficient documentation

## 2022-05-25 DIAGNOSIS — M5136 Other intervertebral disc degeneration, lumbar region: Secondary | ICD-10-CM | POA: Insufficient documentation

## 2022-05-25 DIAGNOSIS — Z13820 Encounter for screening for osteoporosis: Secondary | ICD-10-CM

## 2022-05-25 DIAGNOSIS — E559 Vitamin D deficiency, unspecified: Secondary | ICD-10-CM | POA: Diagnosis not present

## 2022-05-25 DIAGNOSIS — G8929 Other chronic pain: Secondary | ICD-10-CM

## 2022-05-25 DIAGNOSIS — M5441 Lumbago with sciatica, right side: Secondary | ICD-10-CM

## 2022-05-25 DIAGNOSIS — I10 Essential (primary) hypertension: Secondary | ICD-10-CM

## 2022-05-25 DIAGNOSIS — E7849 Other hyperlipidemia: Secondary | ICD-10-CM

## 2022-05-25 DIAGNOSIS — R7301 Impaired fasting glucose: Secondary | ICD-10-CM

## 2022-05-25 NOTE — Assessment & Plan Note (Signed)
-  hx of back surgery in 1983 and 1986, but has since worsened due to her frequent falls -Symptoms were aggravated when she fell in 2017 on cement and landed on her back -She reports that she has fallen many times since, with two falls this year -She notes that her back pain has affected her ADLS and mobility -She wears back braces and has implemented conservative management with heat and Tylenol as needed with minimum relief -She c/o of pain in the right hip with numbness and tingling in the right leg -She describes her back pain as throbbing and unbearable -Pain is rated 10/10, but today she rates it 6/10 -She declines physical therapy - Referral placed to neurosurgery -Encouraged conservative management with Tylenol, heat, and the use of her back brace

## 2022-05-25 NOTE — Progress Notes (Signed)
New Patient Office Visit  Subjective:  Patient ID: Kayla Vaughn, female    DOB: 02/23/1941  Age: 81 y.o. MRN: [id number]  CC:  Chief Complaint  Patient presents with   New Patient (Initial Visit)    Pt establishing care, previously seen by [facility]. Pt concerned about her back and hip, has had surgery in the past many years ago, has had falls that have made her pain flare up.     HPI Kayla Vaughn is a 81 y.o. female with past medical history of TIA, Hypertension, hyperlipidemia presents for establishing care. Low back pain with right-sided sciatica: hx of back surgery in [place], but has since worsened due to her frequent falls. Symptoms were aggravated when she fell in 2017 on cement and landed on her back. X-ray after the injury was negative for fractures and dislocations. She reports that she has fallen many times since, with two falls this year. She notes that her back pain has affected her ADLS and mobility. She wears back braces and has implemented conservative management with heat and Tylenol as needed with minimum relief. She c/o of pain in the right hip with numbness and tingling in the right leg.  She describes her back pain as throbbing and unbearable. Pain is rated 10/10, but today she rates it 6/10. She declines physical therapy.   Past Medical History:  Diagnosis Date   Brain aneurysm 1996   Hyperlipemia    Hypertension    Motion sickness    boats, long car rides   Wears dentures    full upper    Past Surgical History:  Procedure Laterality Date   ABDOMINAL HYSTERECTOMY     BACK SURGERY     BRAIN SURGERY  1996   CATARACT EXTRACTION W/PHACO Right 02/06/2021   Procedure: CATARACT EXTRACTION PHACO AND INTRAOCULAR LENS PLACEMENT (IOC) RIGHT 5.43 01:08.1 8.0%;  Surgeon: Leandrew Koyanagi, MD;  Location: [facility];  Service: Ophthalmology;  Laterality: Right;   CATARACT EXTRACTION W/PHACO Left 02/18/2021   Procedure: CATARACT  EXTRACTION PHACO AND INTRAOCULAR LENS PLACEMENT (IOC) LEFT 8.25 01:13.2 11.2%;  Surgeon: Leandrew Koyanagi, MD;  Location: [facility];  Service: Ophthalmology;  Laterality: Left;   CHOLECYSTECTOMY      Family History  Problem Relation Age of Onset   Breast cancer Maternal Grandmother     Social History   Socioeconomic History   Marital status: Legally Separated    Spouse name: Not on file   Number of children: Not on file   Years of education: Not on file   Highest education level: Not on file  Occupational History   Occupation: retired  Tobacco Use   Smoking status: Former    Types: Cigarettes    Quit date: 1994    Years since quitting: 29.6   Smokeless tobacco: Never  Vaping Use   Vaping Use: Never used  Substance and Sexual Activity   Alcohol use: No   Drug use: No   Sexual activity: Not on file  Other Topics Concern   Not on file  Social History Narrative   Not on file   Social Determinants of Health   Financial Resource Strain: Not on file  Food Insecurity: Not on file  Transportation Needs: Not on file  Physical Activity: Not on file  Stress: Not on file  Social Connections: Not on file  Intimate Partner Violence: Not on file    ROS Review of Systems  Constitutional:  Negative for  chills and fever.  HENT:  Negative for sinus pain, sneezing and sore throat.   Respiratory:  Negative for chest tightness and shortness of breath.   Cardiovascular:  Negative for chest pain and palpitations.  Gastrointestinal:  Negative for diarrhea and vomiting.  Genitourinary:  Negative for frequency and vaginal discharge.  Musculoskeletal:  Positive for back pain.  Skin:  Negative for rash and wound.  Neurological:  Negative for dizziness, tremors and headaches.  Psychiatric/Behavioral:  Negative for self-injury and suicidal ideas.     Objective:   Today's Vitals: BP 113/78   Pulse 69   Ht _0  (1.702 m)   Wt 190 lb 0.6 oz (86.2 kg)   SpO2 97%   BMI  29.76 kg/m   Physical Exam HENT:     Head: Normocephalic.     Right Ear: External ear normal.     Left Ear: External ear normal.     Nose: No congestion.     Mouth/Throat:     Mouth: Mucous membranes are moist.  Eyes:     Extraocular Movements: Extraocular movements intact.     Pupils: Pupils are equal, round, and reactive to light.  Cardiovascular:     Rate and Rhythm: Normal rate and regular rhythm.     Pulses: Normal pulses.     Heart sounds: Normal heart sounds.  Pulmonary:     Effort: Pulmonary effort is normal.     Breath sounds: Normal breath sounds.  Abdominal:     Palpations: Abdomen is soft.     Tenderness: There is no right CVA tenderness or left CVA tenderness.  Musculoskeletal:     Cervical back: No rigidity.     Lumbar back: Tenderness present. No swelling, deformity or lacerations.     Right hip: Tenderness present. No deformity, lacerations or crepitus.  Neurological:     Mental Status: She is alert.     Cranial Nerves: No facial asymmetry.     Sensory: Sensation is intact.     Coordination: Heel to Shin Test abnormal. Finger-Nose-Finger Test normal.     Gait: Gait abnormal.     Assessment & Plan:   Problem List Items Addressed This Visit       Cardiovascular and Mediastinum   Hypertension   Relevant Orders   CBC with Differential/Platelet   CMP14+EGFR     Other   Hyperlipemia   Relevant Orders   TSH + free T4   Lipid Profile   Low back pain    -hx of back surgery in 1983 and 1986, but has since worsened due to her frequent falls -Symptoms were aggravated when she fell in 2017 on cement and landed on her back -She reports that she has fallen many times since, with two falls this year -She notes that her back pain has affected her ADLS and mobility -She wears back braces and has implemented conservative management with heat and Tylenol as needed with minimum relief -She c/o of pain in the right hip with numbness and tingling in the right  leg -She describes her back pain as throbbing and unbearable -Pain is rated 10/10, but today she rates it 6/10 -She declines physical therapy - Referral placed to neurosurgery -Encouraged conservative management with Tylenol, heat, and the use of her back brace        Relevant Orders   Ambulatory referral to Neurosurgery   Other Visit Diagnoses     Osteoporosis screening    -  Primary   Relevant Orders  DG Bone Density   Need for hepatitis C screening test       Relevant Orders   Hepatitis C Antibody   Vitamin D deficiency       Relevant Orders   VITAMIN D 25 Hydroxy (Vit-D Deficiency, Fractures)   IFG (impaired fasting glucose)       Relevant Orders   Hemoglobin A1C       Outpatient Encounter Medications as of 05/25/2022  Medication Sig   ALPRAZolam (XANAX) 0.25 MG tablet Take 0.25 mg by mouth. Prn when traveling for panic attack per pt   amLODipine (NORVASC) 10 MG tablet Take 10 mg by mouth daily.   apixaban (ELIQUIS) 5 MG TABS tablet Take 1 tablet (5 mg total) by mouth 2 (two) times daily.   Brimonidine Tartrate-Timolol (COMBIGAN OP) Apply to eye in the morning and at bedtime.   cholecalciferol (VITAMIN D) 1000 units tablet Take 5,000 Units by mouth daily.   famotidine (PEPCID) 20 MG tablet 1 tablet at bedtime as needed   fluticasone (FLONASE) 50 MCG/ACT nasal spray Place 1 spray into both nostrils daily as needed for allergies or rhinitis.   latanoprost (XALATAN) 0.005 % ophthalmic solution 1 drop at bedtime.   lisinopril (PRINIVIL,ZESTRIL) 20 MG tablet Take 20 mg by mouth daily.   Multiple Vitamins-Minerals (WOMENS 50+ MULTI VITAMIN/MIN PO) Take by mouth.   nystatin cream (MYCOSTATIN) Apply topically 2 (two) times daily.   omega-3 acid ethyl esters (LOVAZA) 1 g capsule Take 1 g by mouth daily.   aspirin EC 81 MG EC tablet Take 1 tablet (81 mg total) by mouth daily. Swallow whole. (Patient not taking: Reported on 05/25/2022)   atorvastatin (LIPITOR) 40 MG tablet Take 1  tablet (40 mg total) by mouth daily. (Patient not taking: Reported on 05/25/2022)   nitroGLYCERIN (NITROSTAT) 0.4 MG SL tablet Place 1 tablet (0.4 mg total) under the tongue every 5 (five) minutes as needed for chest pain. (Patient not taking: Reported on 05/25/2022)   No facility-administered encounter medications on file as of 05/25/2022.    Follow-up: Return in about 4 months (around 09/24/2022).   Alvira Monday, FNP

## 2022-05-25 NOTE — Patient Instructions (Signed)
I appreciate the opportunity to provide care to you today!    Follow up:  4 months  Labs: please stop by the lab during the week to get your blood drawn (CBC, CMP, TSH, Lipid profile, HgA1c, Vit D)  Screening: HIV and Hep C  Referrals today- neurosurgery   Please continue to a heart-healthy diet and increase your physical activities. Try to exercise for at least three times a week.      It was a pleasure to see you and I look forward to continuing to work together on your health and well-being. Please do not hesitate to call the office if you need care or have questions about your care.   Have a wonderful day and week. With Gratitude, Gilmore Laroche MSN, FNP-BC

## 2022-06-04 ENCOUNTER — Other Ambulatory Visit: Payer: Self-pay | Admitting: Family Medicine

## 2022-06-04 ENCOUNTER — Telehealth: Payer: Self-pay | Admitting: Family Medicine

## 2022-06-04 NOTE — Telephone Encounter (Signed)
Patient needs refill on ALPRAZolam (XANAX) 0.25 MG tablet    [id number] - [place], [place] - [address]  [address] [place] [address]

## 2022-06-04 NOTE — Telephone Encounter (Signed)
Controlled med, please advice?

## 2022-06-05 LAB — CBC WITH DIFFERENTIAL/PLATELET
Basophils Absolute: 0 10*3/uL (ref 0.0–0.2)
Basos: 1 %
EOS (ABSOLUTE): 0.1 10*3/uL (ref 0.0–0.4)
Eos: 3 %
Hematocrit: 41.5 % (ref 34.0–46.6)
Hemoglobin: 13.1 g/dL (ref 11.1–15.9)
Immature Grans (Abs): 0 10*3/uL (ref 0.0–0.1)
Immature Granulocytes: 0 %
Lymphocytes Absolute: 1.1 10*3/uL (ref 0.7–3.1)
Lymphs: 38 %
MCH: 27.6 pg (ref 26.6–33.0)
MCHC: 31.6 g/dL (ref 31.5–35.7)
MCV: 87 fL (ref 79–97)
Monocytes Absolute: 0.2 10*3/uL (ref 0.1–0.9)
Monocytes: 8 %
Neutrophils Absolute: 1.4 10*3/uL (ref 1.4–7.0)
Neutrophils: 50 %
Platelets: 237 10*3/uL (ref 150–450)
RBC: 4.75 x10E6/uL (ref 3.77–5.28)
RDW: 15.1 % (ref 11.7–15.4)
WBC: 2.8 10*3/uL — ABNORMAL LOW (ref 3.4–10.8)

## 2022-06-05 LAB — CMP14+EGFR
ALT: 15 IU/L (ref 0–32)
AST: 16 IU/L (ref 0–40)
Albumin/Globulin Ratio: 1.8 (ref 1.2–2.2)
Albumin: 4.1 g/dL (ref 3.8–4.8)
Alkaline Phosphatase: 69 IU/L (ref 44–121)
BUN/Creatinine Ratio: 12 (ref 12–28)
BUN: 15 mg/dL (ref 8–27)
Bilirubin Total: 0.4 mg/dL (ref 0.0–1.2)
CO2: 22 mmol/L (ref 20–29)
Calcium: 9.8 mg/dL (ref 8.7–10.3)
Chloride: 105 mmol/L (ref 96–106)
Creatinine, Ser: 1.28 mg/dL — ABNORMAL HIGH (ref 0.57–1.00)
Globulin, Total: 2.3 g/dL (ref 1.5–4.5)
Glucose: 95 mg/dL (ref 70–99)
Potassium: 4 mmol/L (ref 3.5–5.2)
Sodium: 142 mmol/L (ref 134–144)
Total Protein: 6.4 g/dL (ref 6.0–8.5)
eGFR: 43 mL/min/{1.73_m2} — ABNORMAL LOW (ref >59)

## 2022-06-05 LAB — HEMOGLOBIN A1C
Est. average glucose Bld gHb Est-mCnc: 128 mg/dL
Hgb A1c MFr Bld: 6.1 % — ABNORMAL HIGH (ref 4.8–5.6)

## 2022-06-05 LAB — LIPID PANEL
Chol/HDL Ratio: 4 ratio (ref 0.0–4.4)
Cholesterol, Total: 186 mg/dL (ref 100–199)
HDL: 47 mg/dL (ref >39)
LDL Chol Calc (NIH): 123 mg/dL — ABNORMAL HIGH (ref 0–99)
Triglycerides: 90 mg/dL (ref 0–149)
VLDL Cholesterol Cal: 16 mg/dL (ref 5–40)

## 2022-06-05 LAB — TSH+FREE T4
Free T4: 1.25 ng/dL (ref 0.82–1.77)
TSH: 5.65 u[IU]/mL — ABNORMAL HIGH (ref 0.450–4.500)

## 2022-06-05 LAB — HEPATITIS C ANTIBODY: Hep C Virus Ab: NONREACTIVE

## 2022-06-05 LAB — VITAMIN D 25 HYDROXY (VIT D DEFICIENCY, FRACTURES): Vit D, 25-Hydroxy: 53.3 ng/mL (ref 30.0–100.0)

## 2022-06-07 ENCOUNTER — Other Ambulatory Visit: Payer: Self-pay | Admitting: Family Medicine

## 2022-06-07 DIAGNOSIS — E038 Other specified hypothyroidism: Secondary | ICD-10-CM

## 2022-06-07 DIAGNOSIS — E785 Hyperlipidemia, unspecified: Secondary | ICD-10-CM

## 2022-06-07 MED ORDER — ATORVASTATIN CALCIUM 40 MG PO TABS: 40.0000 mg | ORAL_TABLET | Freq: Every day | ORAL | 1 refills | Status: DC

## 2022-06-07 NOTE — Progress Notes (Signed)
Please inform the patient that she has prediabetes and to return for labs in 6 weeks to check her thyroid levels. Her TSH is mildly elevated. Her LDL is not at goal. I have sent a refill of her lipitor to the pharmacy and encouraged her to start taking medications.

## 2022-06-07 NOTE — Progress Notes (Unsigned)
The 10-year ASCVD risk score (Arnett DK, et al., 2019) is: 13.1%   Values used to calculate the score:     Age: 81 years     Sex: Female     Is Non-Hispanic African American: Yes     Diabetic: No     Tobacco smoker: No     Systolic Blood Pressure: 113 mmHg     Is BP treated: Yes     HDL Cholesterol: 47 mg/dL     Total Cholesterol: 186 mg/dL

## 2022-06-07 NOTE — Telephone Encounter (Signed)
Please inquire to find out why she takes the Xanax. This medication has not been refilled since 2019.

## 2022-06-07 NOTE — Telephone Encounter (Signed)
Left a vm asking pt to return call.

## 2022-06-14 NOTE — Telephone Encounter (Signed)
Unable to reach pt

## 2022-06-15 ENCOUNTER — Telehealth: Payer: Self-pay | Admitting: Family Medicine

## 2022-06-15 NOTE — Telephone Encounter (Signed)
Tried calling pt back unable to reach.  

## 2022-06-15 NOTE — Telephone Encounter (Signed)
Pt called in for lab results  °

## 2022-06-18 NOTE — Telephone Encounter (Signed)
Lab letter mailed out. 

## 2022-06-21 ENCOUNTER — Other Ambulatory Visit: Payer: Self-pay | Admitting: Family Medicine

## 2022-06-21 DIAGNOSIS — E785 Hyperlipidemia, unspecified: Secondary | ICD-10-CM

## 2022-06-21 NOTE — Progress Notes (Signed)
This encounter was created in error - please disregard.

## 2022-06-29 ENCOUNTER — Telehealth: Payer: Self-pay

## 2022-06-29 NOTE — Telephone Encounter (Signed)
Spoke to pt went over labs 

## 2022-06-29 NOTE — Telephone Encounter (Signed)
Patient called, has not heard back from anyone about blood work results.

## 2022-07-02 ENCOUNTER — Telehealth: Payer: Self-pay

## 2022-07-02 NOTE — Telephone Encounter (Signed)
Returned Health visitor. Call patient to call our office for correct mailing address. Lab results at front desk in folder.

## 2022-07-07 ENCOUNTER — Telehealth: Payer: Self-pay | Admitting: Family Medicine

## 2022-07-07 ENCOUNTER — Ambulatory Visit (INDEPENDENT_AMBULATORY_CARE_PROVIDER_SITE_OTHER): Payer: Medicare HMO | Admitting: Nurse Practitioner

## 2022-07-07 ENCOUNTER — Encounter: Payer: Self-pay | Admitting: Nurse Practitioner

## 2022-07-07 VITALS — BP 125/84 | HR 68 | Resp 16 | Ht 67.0 in | Wt 195.1 lb

## 2022-07-07 DIAGNOSIS — I10 Essential (primary) hypertension: Secondary | ICD-10-CM | POA: Diagnosis not present

## 2022-07-07 DIAGNOSIS — M25551 Pain in right hip: Secondary | ICD-10-CM

## 2022-07-07 DIAGNOSIS — M545 Low back pain, unspecified: Secondary | ICD-10-CM | POA: Diagnosis not present

## 2022-07-07 DIAGNOSIS — I482 Chronic atrial fibrillation, unspecified: Secondary | ICD-10-CM | POA: Diagnosis not present

## 2022-07-07 DIAGNOSIS — G8929 Other chronic pain: Secondary | ICD-10-CM

## 2022-07-07 NOTE — Assessment & Plan Note (Addendum)
Patient encouraged to continue to take Tylenol as needed , use of heat encouraged and follow-up with neurosurgery

## 2022-07-07 NOTE — Telephone Encounter (Signed)
Please ask patient to schedule an office visit to get an EKG of her heart

## 2022-07-07 NOTE — Assessment & Plan Note (Signed)
EKG today shows Atrial fibrillation  Rate of 66bpm -Right sided conduction defect and right axis -possible right ventricular hypertrophy or posterior fascicular block. -Old anterior infarct.   Last Echocardiogram showed EF of 60 to 65% I have no concern for ischemia today She is not on rate control med  Takes Eliquis 5 mg 2 times daily No sign of fluid overload Patient referred to cardiology

## 2022-07-07 NOTE — Progress Notes (Signed)
Established Patient Office Visit  Subjective   Patient ID: Kayla Vaughn, female    DOB: 1941/01/12  Age: 81 y.o. MRN: [id number]  Chief Complaint  Patient presents with   Shortness of Breath    And feeling like her heart is skipping beats for the past few days. Bothers her more at night than when she is up moving around    Ms. Mclarty with past medical history of chronic A-fib, TIA, hypertension, hyperlipidemia, low back pain right hip pain presents with complaints of irregular heart rhythm.  Permanent Afib.  Patient stated that she feels her heart is off rhythm , has SOB since the past 3 days, her symptom is wosre when she is lying down to sleep. She was previously seeing cardiology in [place] , Dr Beatrix Fetters  would like a referral to a new cardiologist at [facility] .  She denies chest pain, dizziness, syncope.  Complains of chronic right hip pain, chronic low back pain.  Has had back surgery years ago but her back surgery has worsening in recent times takes Tylenol as needed.  Has aching  pain on her right hip rated 5/10 today,but she has not taking Tylenol today.she was referred to neurosurgery at her previous visit but she has not been seen yet.      Review of Systems  Constitutional: Negative.  Negative for chills, fever and weight loss.  Respiratory:  Positive for shortness of breath. Negative for cough, hemoptysis and sputum production.   Cardiovascular: Negative.  Negative for chest pain, palpitations and orthopnea.  Gastrointestinal:  Negative for heartburn, nausea and vomiting.  Musculoskeletal:  Positive for back pain and joint pain.  Neurological:  Negative for dizziness, tingling, tremors and headaches.  Psychiatric/Behavioral:  Negative for depression, substance abuse and suicidal ideas.       Objective:     BP 125/84   Pulse 68   Resp 16   Ht 5\' 7"  (1.702 m)   Wt 195 lb 1.9 oz (88.5 kg)   SpO2 97%   BMI 30.56 kg/m    Physical  Exam Constitutional:      General: She is not in acute distress.    Appearance: She is not ill-appearing, toxic-appearing or diaphoretic.  Cardiovascular:     Rate and Rhythm: Rhythm irregular. No extrasystoles are present.    Pulses: Normal pulses. No decreased pulses.     Heart sounds: No murmur heard.    No friction rub. No gallop.  Pulmonary:     Breath sounds: No decreased breath sounds, wheezing, rhonchi or rales.  Chest:     Chest wall: No mass or deformity.  Musculoskeletal:     Right lower leg: Tenderness present. No edema.     Left lower leg: No edema.     Comments: Has tenderness  on palpation of right low back and right hip  Skin:    General: Skin is warm and dry.     Capillary Refill: Capillary refill takes less than 2 seconds.  Neurological:     Mental Status: She is alert.  Psychiatric:        Mood and Affect: Mood normal. Mood is not anxious.        Behavior: Behavior normal. Behavior is not agitated.      No results found for any visits on 07/07/22.    The 10-year ASCVD risk score (Arnett DK, et al., 2019) is: 15.4%    Assessment & Plan:   Problem List Items Addressed  This Visit       Cardiovascular and Mediastinum   Hypertension    BP Readings from Last 3 Encounters:  07/07/22 125/84  05/25/22 113/78  10/05/21 (!) 161/98  Medical condition well-controlled on amlodipine 10 mg daily, lisinopril 20 mg daily continue current medications DASH diet advised, engage in regular daily walking exercises as tolerated      Atrial fibrillation, chronic (HCC) - Primary    EKG today shows Atrial fibrillation  Rate of 66bpm -Right sided conduction defect and right axis -possible right ventricular hypertrophy or posterior fascicular block. -Old anterior infarct.   Last Echocardiogram showed EF of 60 to 65% I have no concern for ischemia today She is not on rate control med  Takes Eliquis 5 mg 2 times daily No sign of fluid overload Patient referred to  cardiology      Relevant Orders   EKG 12-Lead (Completed)   Ambulatory referral to Cardiology     Other   Low back pain    Patient encouraged to continue to take Tylenol as needed , use of heat encouraged and follow-up with neurosurgery      Right hip pain    Takes tylenol as needed, applies heat as needed, rated her aching pain as a 5/10 but has not taken tylenol. Patient encouraged to continue to take Tylenol as needed , use of heat encouraged and follow-up with neurosurgery       No follow-ups on file.    Donell Beers, FNP

## 2022-07-07 NOTE — Assessment & Plan Note (Signed)
BP Readings from Last 3 Encounters:  07/07/22 125/84  05/25/22 113/78  10/05/21 (!) 161/98  Medical condition well-controlled on amlodipine 10 mg daily, lisinopril 20 mg daily continue current medications DASH diet advised, engage in regular daily walking exercises as tolerated

## 2022-07-07 NOTE — Telephone Encounter (Signed)
Pt called she is wanting to know if she can please be referred to [facility]iology for [facility] location. States that she thinks her heart out of rhythm?

## 2022-07-07 NOTE — Telephone Encounter (Signed)
Tried calling pt back, vm box full. 

## 2022-07-07 NOTE — Assessment & Plan Note (Addendum)
Takes tylenol as needed, applies heat as needed, rated her aching pain as a 5/10 but has not taken tylenol. Patient encouraged to continue to take Tylenol as needed , use of heat encouraged and follow-up with neurosurgery

## 2022-07-07 NOTE — Patient Instructions (Addendum)
Please continue tylenol as needed, you may take tylenol 500 mg every 6 hours as needed, follow up with othopedics as dicussed    You have been referred to cardiologist as requested   It is important that you exercise regularly at least 30 minutes 5 times a week.  Think about what you will eat, plan ahead. Choose " clean, green, fresh or frozen" over canned, processed or packaged foods which are more sugary, salty and fatty. 70 to 75% of food eaten should be vegetables and fruit. Three meals at set times with snacks allowed between meals, but they must be fruit or vegetables. Aim to eat over a 12 hour period , example 7 am to 7 pm, and STOP after  your last meal of the day. Drink water,generally about 64 ounces per day, no other drink is as healthy. Fruit juice is best enjoyed in a healthy way, by EATING the fruit.  Thanks for choosing [facility], we consider it a privelige to serve you.

## 2022-07-08 NOTE — Telephone Encounter (Signed)
She had a ov yesterday AND ekg and was already referred to cardiology [place] location by [facility]

## 2022-08-03 ENCOUNTER — Other Ambulatory Visit: Payer: Self-pay

## 2022-08-03 ENCOUNTER — Other Ambulatory Visit: Payer: Self-pay | Admitting: Family Medicine

## 2022-08-03 ENCOUNTER — Telehealth: Payer: Self-pay | Admitting: Family Medicine

## 2022-08-03 DIAGNOSIS — I10 Essential (primary) hypertension: Secondary | ICD-10-CM

## 2022-08-03 DIAGNOSIS — F419 Anxiety disorder, unspecified: Secondary | ICD-10-CM

## 2022-08-03 DIAGNOSIS — I482 Chronic atrial fibrillation, unspecified: Secondary | ICD-10-CM

## 2022-08-03 MED ORDER — LISINOPRIL 20 MG PO TABS: 20.0000 mg | ORAL_TABLET | Freq: Every day | ORAL | 2 refills | Status: DC

## 2022-08-03 MED ORDER — AMLODIPINE BESYLATE 10 MG PO TABS: 10.0000 mg | ORAL_TABLET | Freq: Every day | ORAL | 2 refills | Status: DC

## 2022-08-03 MED ORDER — APIXABAN 5 MG PO TABS: 5.0000 mg | ORAL_TABLET | Freq: Two times a day (BID) | ORAL | 2 refills | Status: DC

## 2022-08-03 NOTE — Telephone Encounter (Signed)
Spoke to pt she is asking for a refill on her alprazolam only uses it when she really needs especially when travelling, please advice?

## 2022-08-03 NOTE — Telephone Encounter (Signed)
Please place a referral to psychiatry,thank you

## 2022-08-03 NOTE — Telephone Encounter (Signed)
Refill has been sent.  °

## 2022-08-03 NOTE — Telephone Encounter (Signed)
Last refill was sent in 2018 and only takes it when she really needs it, states she has 22 tablets left. She just wants a fresh rx bc they might not be effective.

## 2022-08-03 NOTE — Telephone Encounter (Signed)
Pt called stating she is out of her BP medication. Wants to know if you can please refill?  lisinopril (PRINIVIL,ZESTRIL) 20 MG tablet  [facility]

## 2022-08-03 NOTE — Telephone Encounter (Signed)
Please place a referral to psychiatry

## 2022-08-03 NOTE — Telephone Encounter (Signed)
Referral has been placed. 

## 2022-08-04 ENCOUNTER — Other Ambulatory Visit: Payer: Self-pay

## 2022-08-04 DIAGNOSIS — E038 Other specified hypothyroidism: Secondary | ICD-10-CM

## 2022-08-05 LAB — TSH+FREE T4
Free T4: 1.26 ng/dL (ref 0.82–1.77)
TSH: 3.15 u[IU]/mL (ref 0.450–4.500)

## 2022-08-05 NOTE — Progress Notes (Signed)
Please inform the patient that her thyroid levels are within normal range and to continue treatment as prescribed.

## 2022-08-17 ENCOUNTER — Ambulatory Visit (INDEPENDENT_AMBULATORY_CARE_PROVIDER_SITE_OTHER): Payer: Medicare HMO | Admitting: Family Medicine

## 2022-08-17 ENCOUNTER — Encounter: Payer: Self-pay | Admitting: Family Medicine

## 2022-08-17 ENCOUNTER — Telehealth: Payer: Self-pay

## 2022-08-17 VITALS — BP 114/80 | HR 73 | Ht 67.0 in | Wt 188.1 lb

## 2022-08-17 DIAGNOSIS — N3090 Cystitis, unspecified without hematuria: Secondary | ICD-10-CM

## 2022-08-17 DIAGNOSIS — R350 Frequency of micturition: Secondary | ICD-10-CM | POA: Diagnosis not present

## 2022-08-17 DIAGNOSIS — F419 Anxiety disorder, unspecified: Secondary | ICD-10-CM | POA: Diagnosis not present

## 2022-08-17 LAB — POCT URINALYSIS DIP (CLINITEK)
Bilirubin, UA: NEGATIVE
Glucose, UA: NEGATIVE mg/dL
Ketones, POC UA: NEGATIVE mg/dL
Nitrite, UA: NEGATIVE
POC PROTEIN,UA: NEGATIVE
Spec Grav, UA: 1.015 (ref 1.010–1.025)
Urobilinogen, UA: 0.2 E.U./dL
pH, UA: 7 (ref 5.0–8.0)

## 2022-08-17 MED ORDER — AMOXICILLIN-POT CLAVULANATE 875-125 MG PO TABS: 1.0000 | ORAL_TABLET | Freq: Two times a day (BID) | ORAL | 0 refills | Status: AC

## 2022-08-17 MED ORDER — ALPRAZOLAM 0.25 MG PO TABS: 0.2500 mg | ORAL_TABLET | ORAL | 0 refills | Status: DC | PRN

## 2022-08-17 NOTE — Assessment & Plan Note (Signed)
UA shows a trace of leukocytes She has allergies to sulfa antibiotics Patient is symptomatic, will go ahead and treat with Augmentin twice daily for 5 days Please complete the full course of the antibiotics   You can help prevent UTIs by doing the following:  -Avoid holding urine for prolonged periods; this stretches the bladder and causes bacteria to form because bacteria like warm and wet environments to grow -Empty the bladder as soon as the need arises.  -Empty your bladder soon after intercourse.  -Take showers instead of baths -Wipe front to back; doing so after urinating and after a bowel movement helps prevent bacteria in the anal region from spreading to the vagina and urethra. -Also, drink a full glass of water to help flush bacteria.

## 2022-08-17 NOTE — Patient Instructions (Addendum)
I appreciate the opportunity to provide care to you today!   Follow up: 09/24/2022  Please pick up your prescription at the pharmacy and complete the full course of treatment I also sent a refill of Xanax to your pharmacy to take for anxiety as needed. Please complete the full course of the antibiotics   You can help prevent UTIs by doing the following:  -Avoid holding urine for prolonged periods; this stretches the bladder and causes bacteria to form because bacteria like warm and wet environments to grow -Empty the bladder as soon as the need arises.  -Empty your bladder soon after intercourse.  -Take showers instead of baths -Wipe front to back; doing so after urinating and after a bowel movement helps prevent bacteria in the anal region from spreading to the vagina and urethra. -Also, drink a full glass of water to help flush bacteria.     Please continue to a heart-healthy diet and increase your physical activities. Try to exercise for 46mins at least three times a week.      It was a pleasure to see you and I look forward to continuing to work together on your health and well-being. Please do not hesitate to call the office if you need care or have questions about your care.   Have a wonderful day and week. With Gratitude, Alvira Monday MSN, FNP-BC

## 2022-08-17 NOTE — Progress Notes (Signed)
Acute Office Visit  Subjective:     Patient ID: Kayla Vaughn, female    DOB: [birth year], 81 y.o.   MRN: [id number]  Chief Complaint  Patient presents with   Urinary Tract Infection    Pt reports urinary frequency, burning and pressure, recently changed underwear brand. Sx started 08/10/22.    HPI Patient is in today with complaints of urgency, frequency, dysuria, and suprapubic pain since 08/10/2022.  She denies fever and chills.  She denies vaginal discharge and odor.  Review of Systems  Constitutional:  Negative for chills and fever.  Cardiovascular:  Negative for chest pain.  Genitourinary:  Positive for dysuria, frequency and urgency. Negative for flank pain and hematuria.  Neurological:  Negative for dizziness and headaches.        Objective:    BP 114/80   Pulse 73   Ht 5\' 7"  (1.702 m)   Wt 188 lb 1.9 oz (85.3 kg)   SpO2 98%   BMI 29.46 kg/m    Physical Exam HENT:     Head: Normocephalic.  Cardiovascular:     Rate and Rhythm: Normal rate and regular rhythm.     Pulses: Normal pulses.     Heart sounds: Normal heart sounds.  Pulmonary:     Effort: Pulmonary effort is normal.     Breath sounds: Normal breath sounds.  Abdominal:     Tenderness: There is abdominal tenderness in the suprapubic area. There is no right CVA tenderness or left CVA tenderness.  Neurological:     Mental Status: She is alert.     Results for orders placed or performed in visit on 08/17/22  POCT URINALYSIS DIP (CLINITEK)  Result Value Ref Range   Color, UA yellow yellow   Clarity, UA clear clear   Glucose, UA negative negative mg/dL   Bilirubin, UA negative negative   Ketones, POC UA negative negative mg/dL   Spec Grav, UA 1.015 1.010 - 1.025   Blood, UA trace-intact (A) negative   pH, UA 7.0 5.0 - 8.0   POC PROTEIN,UA negative negative, trace   Urobilinogen, UA 0.2 0.2 or 1.0 E.U./dL   Nitrite, UA Negative Negative   Leukocytes, UA Trace (A) Negative         Assessment & Plan:   Problem List Items Addressed This Visit       Genitourinary   Cystitis - Primary    UA shows a trace of leukocytes She has allergies to sulfa antibiotics Patient is symptomatic, will go ahead and treat with Augmentin twice daily for 5 days Please complete the full course of the antibiotics   You can help prevent UTIs by doing the following:  -Avoid holding urine for prolonged periods; this stretches the bladder and causes bacteria to form because bacteria like warm and wet environments to grow -Empty the bladder as soon as the need arises.  -Empty your bladder soon after intercourse.  -Take showers instead of baths -Wipe front to back; doing so after urinating and after a bowel movement helps prevent bacteria in the anal region from spreading to the vagina and urethra. -Also, drink a full glass of water to help flush bacteria.         Relevant Medications   amoxicillin-clavulanate (AUGMENTIN) 875-125 MG tablet     Other   Anxiety    Refilled Xanax to be used as needed only She denies suicidal ideation and homicidal ideations      Relevant Medications   ALPRAZolam (XANAX) 0.25  MG tablet   Other Visit Diagnoses     Urinary frequency       Relevant Medications   amoxicillin-clavulanate (AUGMENTIN) 875-125 MG tablet   Other Relevant Orders   POCT URINALYSIS DIP (CLINITEK) (Completed)       Meds ordered this encounter  Medications   amoxicillin-clavulanate (AUGMENTIN) 875-125 MG tablet    Sig: Take 1 tablet by mouth 2 (two) times daily for 5 days.    Dispense:  10 tablet    Refill:  0   ALPRAZolam (XANAX) 0.25 MG tablet    Sig: Take 1 tablet (0.25 mg total) by mouth as needed for anxiety. Prn when traveling for panic attack per pt    Dispense:  30 tablet    Refill:  0    Return in about 5 weeks (around 09/24/2022).  Gilmore Laroche, FNP

## 2022-08-17 NOTE — Telephone Encounter (Signed)
Patient calling about lab results. 

## 2022-08-17 NOTE — Assessment & Plan Note (Signed)
Refilled Xanax to be used as needed only She denies suicidal ideation and homicidal ideations

## 2022-08-18 ENCOUNTER — Encounter: Payer: Self-pay | Admitting: *Deleted

## 2022-08-18 ENCOUNTER — Encounter: Payer: Self-pay | Admitting: Internal Medicine

## 2022-08-18 ENCOUNTER — Ambulatory Visit (INDEPENDENT_AMBULATORY_CARE_PROVIDER_SITE_OTHER): Payer: Medicare HMO | Admitting: Internal Medicine

## 2022-08-18 DIAGNOSIS — Z0000 Encounter for general adult medical examination without abnormal findings: Secondary | ICD-10-CM

## 2022-08-18 NOTE — Patient Instructions (Addendum)
  Kayla Vaughn , Thank you for taking time to come for your Medicare Wellness Visit. I appreciate your ongoing commitment to your health goals. Please review the following plan we discussed and let me know if I can assist you in the future.   These are the goals we discussed:  Goals      Increase physical activity     Increase activity , target 30 minutes of exercise a day for 5 days a week. Total of 150 minutes of exercise per week.         Referral placed for Colonoscopy  You will go to the mobile unit for Dexa Scan.   You are having records sent because you received your pneumonia vaccine and Tetanus vaccine at your pharmacy.  You will obtain your COVID booster after finishing antibiotics.  You are schedule for your second Shingles vaccine in November.    This is a list of the screening recommended for you and due dates:  Health Maintenance  Topic Date Due   Medicare Annual Wellness Visit  Never done   COVID-19 Vaccine (1) Never done   Tetanus Vaccine  Never done   Zoster (Shingles) Vaccine (1 of 2) Never done   Pneumonia Vaccine (1 - PCV) Never done   DEXA scan (bone density measurement)  Never done   Flu Shot  Completed   Hepatitis C Screening: USPSTF Recommendation to screen - Ages 12-79 yo.  Completed   HPV Vaccine  Aged Out

## 2022-08-18 NOTE — Progress Notes (Signed)
Subjective:  This is a telephone encounter between Kayla Vaughn and Kayla Vaughn on 08/18/2022 for AWV. The visit was conducted with the patient located at home and Kayla Vaughn at [facility]. The patient's identity was confirmed using their DOB and current address. The patient has consented to being evaluated through a telephone encounter and understands the associated risks (an examination cannot be done and the patient may need to come in for an appointment) / benefits (allows the patient to remain at home, decreasing exposure to coronavirus).    Kayla Vaughn is a 81 y.o. female who presents for Medicare Annual (Subsequent) preventive examination.  Review of Systems    Review of Systems  All other systems reviewed and are negative.   Objective:    There were no vitals filed for this visit. There is no height or weight on file to calculate BMI.     08/18/2022    8:16 AM 10/05/2021    3:32 PM 09/14/2021    8:19 AM 09/13/2021    4:19 AM 09/10/2021    5:13 PM 02/18/2021    9:28 AM 11/26/2017    2:31 AM  Advanced Directives  Does Patient Have a Medical Advance Directive? No No No No No Yes Yes  Type of Careers adviser;Living will Living will;Healthcare Power of Attorney  Does patient want to make changes to medical advance directive?      No - Patient declined No - Patient declined  Copy of Healthcare Power of Attorney in Chart?      No - copy requested No - copy requested  Would patient like information on creating a medical advance directive? Yes (MAU/Ambulatory/Procedural Areas - Information given) No - Patient declined No - Patient declined No - Patient declined No - Patient declined      Current Medications (verified) Outpatient Encounter Medications as of 08/18/2022  Medication Sig   amLODipine (NORVASC) 10 MG tablet Take 1 tablet (10 mg total) by mouth daily.   amoxicillin-clavulanate (AUGMENTIN) 875-125 MG tablet Take 1 tablet by mouth 2  (two) times daily for 5 days.   apixaban (ELIQUIS) 5 MG TABS tablet Take 1 tablet (5 mg total) by mouth 2 (two) times daily.   cholecalciferol (VITAMIN D) 1000 units tablet Take 5,000 Units by mouth daily.   fluticasone (FLONASE) 50 MCG/ACT nasal spray Place 1 spray into both nostrils daily as needed for allergies or rhinitis.   latanoprost (XALATAN) 0.005 % ophthalmic solution 1 drop at bedtime.   lisinopril (ZESTRIL) 20 MG tablet Take 1 tablet (20 mg total) by mouth daily.   Multiple Vitamins-Minerals (WOMENS 50+ MULTI VITAMIN/MIN PO) Take by mouth.   nitroGLYCERIN (NITROSTAT) 0.4 MG SL tablet Place 1 tablet (0.4 mg total) under the tongue every 5 (five) minutes as needed for chest pain.   omega-3 acid ethyl esters (LOVAZA) 1 g capsule Take 1 g by mouth daily.   [DISCONTINUED] ALPRAZolam (XANAX) 0.25 MG tablet Take 1 tablet (0.25 mg total) by mouth as needed for anxiety. Prn when traveling for panic attack per pt   [DISCONTINUED] aspirin EC 81 MG EC tablet Take 1 tablet (81 mg total) by mouth daily. Swallow whole.   [DISCONTINUED] atorvastatin (LIPITOR) 40 MG tablet Take 1 tablet (40 mg total) by mouth daily.   [DISCONTINUED] Brimonidine Tartrate-Timolol (COMBIGAN OP) Apply to eye in the morning and at bedtime.   [DISCONTINUED] famotidine (PEPCID) 20 MG tablet    [DISCONTINUED] nystatin cream (  MYCOSTATIN) Apply topically 2 (two) times daily.   No facility-administered encounter medications on file as of 08/18/2022.    Allergies (verified) Elemental sulfur, Sulfa antibiotics, and Sulfur   History: Past Medical History:  Diagnosis Date   Brain aneurysm 1996   Hyperlipemia    Hypertension    Motion sickness    boats, long car rides   Wears dentures    full upper   Past Surgical History:  Procedure Laterality Date   ABDOMINAL HYSTERECTOMY     BACK SURGERY     BRAIN SURGERY  1996   CATARACT EXTRACTION W/PHACO Right 02/06/2021   Procedure: CATARACT EXTRACTION PHACO AND INTRAOCULAR  LENS PLACEMENT (IOC) RIGHT 5.43 01:08.1 8.0%;  Surgeon: Leandrew Koyanagi, MD;  Location: [facility];  Service: Ophthalmology;  Laterality: Right;   CATARACT EXTRACTION W/PHACO Left 02/18/2021   Procedure: CATARACT EXTRACTION PHACO AND INTRAOCULAR LENS PLACEMENT (IOC) LEFT 8.25 01:13.2 11.2%;  Surgeon: Leandrew Koyanagi, MD;  Location: [facility];  Service: Ophthalmology;  Laterality: Left;   CHOLECYSTECTOMY     Family History  Problem Relation Age of Onset   Breast cancer Maternal Grandmother    Social History   Socioeconomic History   Marital status: Legally Separated    Spouse name: Not on file   Number of children: Not on file   Years of education: Not on file   Highest education level: Not on file  Occupational History   Occupation: retired  Tobacco Use   Smoking status: Former    Types: Cigarettes    Quit date: 1994    Years since quitting: 29.8   Smokeless tobacco: Never  Vaping Use   Vaping Use: Never used  Substance and Sexual Activity   Alcohol use: No   Drug use: No   Sexual activity: Not on file  Other Topics Concern   Not on file  Social History Narrative   Not on file   Social Determinants of Health   Financial Resource Strain: Not on file  Food Insecurity: Not on file  Transportation Needs: Not on file  Physical Activity: Not on file  Stress: Not on file  Social Connections: Not on file    Tobacco Counseling Counseling given: Not Answered   Clinical Intake:  Pre-visit preparation completed: Yes  Pain : No/denies pain     Nutritional Status: BMI 25 -29 Overweight Diabetes: No  How often do you need to have someone help you when you read instructions, pamphlets, or other written materials from your doctor or pharmacy?: 2 - Rarely What is the last grade level you completed in school?: community college  Diabetic?no         Activities of Daily Living    09/14/2021    8:19 AM  In your present state of health,  do you have any difficulty performing the following activities:  Hearing? 0  Vision? 0  Difficulty concentrating or making decisions? 0  Walking or climbing stairs? 0  Dressing or bathing? 0  Doing errands, shopping? 0    Patient Care Team: Alvira Monday, FNP as PCP - General (Family Medicine)  Indicate any recent Medical Services you may have received from other than [facility] providers in the past year (date may be approximate).     Assessment:   This is a routine wellness examination for Lyrik.  Hearing/Vision screen No results found.  Dietary issues and exercise activities discussed:     Goals Addressed  This Visit's Progress    Increase physical activity       Increase activity , target 30 minutes of exercise a day for 5 days a week. Total of 150 minutes of exercise per week.        Depression Screen    08/17/2022    8:16 AM 07/07/2022    1:12 PM 05/25/2022    2:01 PM  PHQ 2/9 Scores  PHQ - 2 Score 0 0 0    Fall Risk    08/18/2022    8:18 AM 08/17/2022    8:16 AM 07/07/2022    1:12 PM 05/25/2022    2:01 PM 05/25/2022    2:00 PM  Fall Risk   Falls in the past year? 1 0 1 1 0  Number falls in past yr: 0 0 0 1 0  Injury with Fall? 0 0 0 0 0  Risk for fall due to :  No Fall Risks  History of fall(s) No Fall Risks  Follow up  Falls evaluation completed  Falls evaluation completed Falls evaluation completed    FALL RISK PREVENTION PERTAINING TO THE HOME: Yes  Any stairs in or around the home?  If so, are there any without handrails? Yes  Home free of loose throw rugs in walkways, pet beds, electrical cords, etc? Yes  Adequate lighting in your home to reduce risk of falls? Yes   ASSISTIVE DEVICES UTILIZED TO PREVENT FALLS:  Life alert? No  Use of a cane, walker or w/c? No  Grab bars in the bathroom? No  Shower chair or bench in shower? Yes  Elevated toilet seat or a handicapped toilet? Yes    Cognitive Function:        08/18/2022     8:20 AM  6CIT Screen  What Year? 0 points  What month? 0 points  What time? 0 points  Count back from 20 0 points  Months in reverse 0 points  Repeat phrase 0 points  Total Score 0 points    Immunizations Immunization History  Administered Date(s) Administered   Influenza, Quadrivalent, Recombinant, Inj, Pf 06/21/2022   PNEUMOCOCCAL CONJUGATE-20 09/23/2021   Td 12/24/1997    TDAP status: Up to date Patient is sending information from [facility] so we can update record.  Flu Vaccine status: Up to date  Pneumococcal vaccine status: Up to datePatient is sending information from [facility] so we can update record.  Covid-19 vaccine status: Information provided on how to obtain vaccines.   Qualifies for Shingles Vaccine? Yes   Zostavax completed Yes   Shingrix Completed?: No.    Education has been provided regarding the importance of this vaccine. Patient has been advised to call insurance company to determine out of pocket expense if they have not yet received this vaccine. Advised may also receive vaccine at local pharmacy or Health Dept. Verbalized acceptance and understanding. Received first and schedule for second dose.   Screening Tests Health Maintenance  Topic Date Due   Medicare Annual Wellness (AWV)  Never done   COVID-19 Vaccine (1) Never done   Zoster Vaccines- Shingrix (1 of 2) Never done   TETANUS/TDAP  12/25/2007   DEXA SCAN  Never done   Pneumonia Vaccine 55+ Years old  Completed   INFLUENZA VACCINE  Completed   Hepatitis C Screening  Completed   HPV VACCINES  Aged Out    Health Maintenance  Health Maintenance Due  Topic Date Due   Medicare Annual Wellness (AWV)  Never done   COVID-19 Vaccine (1) Never done   Zoster Vaccines- Shingrix (1 of 2) Never done   TETANUS/TDAP  12/25/2007   DEXA SCAN  Never done    Colorectal cancer screening: Type of screening: Colonoscopy. Completed 2012. Repeat every 10 years  Mammogram status:  Completed last month. Repeat every year  Bone Density status: Ordered  . Pt provided with contact info and advised to call to schedule appt.  Lung Cancer Screening: (Low Dose CT Chest recommended if Age 57-80 years, 30 pack-year currently smoking OR have quit w/in 15years.) does not qualify.    Additional Screening:  Hepatitis C Screening: does not qualify; Completed 06/04/2022  Vision Screening: Recommended annual ophthalmology exams for early detection of glaucoma and other disorders of the eye. Is the patient up to date with their annual eye exam?  Yes  Who is the provider or what is the name of the office in which the patient attends annual eye exams? Dr.Brasington, [place] If pt is not established with a provider, would they like to be referred to a provider to establish care? No .   Dental Screening: Recommended annual dental exams for proper oral hygiene  Community Resource Referral / Chronic Care Management: CRR required this visit?  No   CCM required this visit?  No      Plan:     I have personally reviewed and noted the following in the patient's chart:   Medical and social history Use of alcohol, tobacco or illicit drugs  Current medications and supplements including opioid prescriptions. Patient is not currently taking opioid prescriptions. Functional ability and status Nutritional status Physical activity Advanced directives List of other physicians Hospitalizations, surgeries, and ER visits in previous 12 months Vitals Screenings to include cognitive, depression, and falls Referrals and appointments  In addition, I have reviewed and discussed with patient certain preventive protocols, quality metrics, and best practice recommendations. A written personalized care plan for preventive services as well as general preventive health recommendations were provided to patient.     Kayla Banister, MD   08/18/2022

## 2022-09-13 ENCOUNTER — Ambulatory Visit: Payer: Medicare HMO | Admitting: Gastroenterology

## 2022-09-24 ENCOUNTER — Ambulatory Visit: Payer: Medicare HMO | Admitting: Family Medicine

## 2022-09-28 ENCOUNTER — Other Ambulatory Visit: Payer: Self-pay | Admitting: Family Medicine

## 2022-09-28 DIAGNOSIS — I10 Essential (primary) hypertension: Secondary | ICD-10-CM

## 2022-11-26 DIAGNOSIS — H6123 Impacted cerumen, bilateral: Secondary | ICD-10-CM | POA: Diagnosis not present

## 2022-11-26 DIAGNOSIS — H6982 Other specified disorders of Eustachian tube, left ear: Secondary | ICD-10-CM | POA: Diagnosis not present

## 2022-12-15 ENCOUNTER — Encounter: Payer: Self-pay | Admitting: *Deleted

## 2022-12-15 ENCOUNTER — Encounter: Payer: Self-pay | Admitting: Internal Medicine

## 2022-12-15 ENCOUNTER — Telehealth: Payer: Self-pay | Admitting: *Deleted

## 2022-12-15 ENCOUNTER — Other Ambulatory Visit: Payer: Self-pay | Admitting: *Deleted

## 2022-12-15 ENCOUNTER — Ambulatory Visit (INDEPENDENT_AMBULATORY_CARE_PROVIDER_SITE_OTHER): Payer: Medicare Other | Admitting: Internal Medicine

## 2022-12-15 VITALS — BP 122/81 | HR 81 | Temp 98.3°F | Ht 67.0 in | Wt 188.0 lb

## 2022-12-15 DIAGNOSIS — Z1211 Encounter for screening for malignant neoplasm of colon: Secondary | ICD-10-CM

## 2022-12-15 DIAGNOSIS — Z79899 Other long term (current) drug therapy: Secondary | ICD-10-CM | POA: Diagnosis not present

## 2022-12-15 DIAGNOSIS — I482 Chronic atrial fibrillation, unspecified: Secondary | ICD-10-CM

## 2022-12-15 DIAGNOSIS — Z01818 Encounter for other preprocedural examination: Secondary | ICD-10-CM

## 2022-12-15 MED ORDER — PEG 3350-KCL-NA BICARB-NACL 420 G PO SOLR: 4000.0000 mL | Freq: Once | ORAL | 0 refills | Status: AC

## 2022-12-15 NOTE — Patient Instructions (Signed)
We will schedule you for colonoscopy for colon cancer screening purposes.  I will also evaluate your intermittent rectal bleeding as well as change in stool caliber.  You will need to hold your Eliquis for 48 hours prior to procedure.  There is a slight increased risk of stroke during this time which we discussed today.  If everything looks good, I do not think you ever have to have another colonoscopy again.  It was very nice meeting you today.  Dr. Abbey Chatters

## 2022-12-15 NOTE — Telephone Encounter (Signed)
Pt needs TCS with Dr. Abbey Chatters ASA 3, hold eliquis 48 hrs prior  She was given dates for Dr. Abbey Chatters and will discuss what she can do with her daughter and call in to schedule

## 2022-12-15 NOTE — Telephone Encounter (Signed)
Pt has been scheduled for 01/10/23 at 10:00 am. Instructions mailed and prep sent to the pharmacy

## 2022-12-15 NOTE — Progress Notes (Signed)
Primary Care Physician:  Alvira Monday, [place] Primary Gastroenterologist:  Dr. Abbey Chatters  Chief Complaint  Patient presents with   Colon Cancer Screening    Patient referred for colonoscopy. Reports she sometimes sees a little blood in her stool. Last colonoscopy per patient was 2013 in [place].     HPI:   Kayla Vaughn is a 82 y.o. female who presents to clinic to discuss screening colonoscopy. Last CLN was 2013 in [place].  Believes she may have had a few small polyps though unsure.  I do not have access to this report.  No family history of colorectal malignancy.  No melena hematochezia.  No abdominal pain.  No unintentional weight loss.  Denies any heartburn or reflux.  No epigastric or chest pain.  No dysphagia odynophagia.  Denies any constipation.  States her bowels move well overall.  Does have permanent atrial fibrillation.  Chronically rate controlled, takes Eliquis for stroke prophylaxis.  Sees cardiology yearly.  Nuclear med stress test 09/14/2021 normal.  2D echocardiogram 09/13/2021 with normal LVEF  Past Medical History:  Diagnosis Date   Atrial fibrillation ([place])    Brain aneurysm 1996   Hyperlipemia    Hypertension    Motion sickness    boats, long car rides   Wears dentures    full upper    Past Surgical History:  Procedure Laterality Date   ABDOMINAL HYSTERECTOMY     BACK SURGERY     BRAIN SURGERY  1996   CATARACT EXTRACTION W/PHACO Right 02/06/2021   Procedure: CATARACT EXTRACTION PHACO AND INTRAOCULAR LENS PLACEMENT (IOC) RIGHT 5.43 01:08.1 8.0%;  Surgeon: Leandrew Koyanagi, MD;  Location: [facility];  Service: Ophthalmology;  Laterality: Right;   CATARACT EXTRACTION W/PHACO Left 02/18/2021   Procedure: CATARACT EXTRACTION PHACO AND INTRAOCULAR LENS PLACEMENT (IOC) LEFT 8.25 01:13.2 11.2%;  Surgeon: Leandrew Koyanagi, MD;  Location: [facility];  Service: Ophthalmology;  Laterality: Left;   CEREBRAL ANEURYSM REPAIR      CHOLECYSTECTOMY      Current Outpatient Medications  Medication Sig Dispense Refill   ALPRAZolam (XANAX) 0.25 MG tablet Take 0.25 mg by mouth. As needed when traveling.     amLODipine (NORVASC) 10 MG tablet Take 1 tablet (10 mg total) by mouth daily. 30 tablet 2   apixaban (ELIQUIS) 5 MG TABS tablet Take 1 tablet (5 mg total) by mouth 2 (two) times daily. 60 tablet 2   Brimonidine Tartrate-Timolol (COMBIGAN OP) Apply to eye. One drop both eyes in the mornings     cholecalciferol (VITAMIN D) 1000 units tablet Take 5,000 Units by mouth daily.     latanoprost (XALATAN) 0.005 % ophthalmic solution 1 drop at bedtime.     Multiple Vitamins-Minerals (WOMENS 50+ MULTI VITAMIN/MIN PO) Take by mouth.     nitroGLYCERIN (NITROSTAT) 0.4 MG SL tablet Place 1 tablet (0.4 mg total) under the tongue every 5 (five) minutes as needed for chest pain. 20 tablet 12   omega-3 acid ethyl esters (LOVAZA) 1 g capsule Take 1 g by mouth daily.     lisinopril (ZESTRIL) 20 MG tablet TAKE 1 TABLET BY MOUTH EVERY DAY 90 tablet 1   No current facility-administered medications for this visit.    Allergies as of 12/15/2022 - Review Complete 12/15/2022  Allergen Reaction Noted   Elemental sulfur  11/26/2017   Sulfa antibiotics Other (See Comments) 11/26/2017   Sulfur Other (See Comments) 11/26/2017    Family History  Problem Relation Age of Onset   Breast  cancer Maternal Grandmother     Social History   Socioeconomic History   Marital status: Legally Separated    Spouse name: Not on file   Number of children: Not on file   Years of education: Not on file   Highest education level: Not on file  Occupational History   Occupation: retired  Tobacco Use   Smoking status: Former    Types: Cigarettes    Quit date: 1994    Years since quitting: 30.1    Passive exposure: Past   Smokeless tobacco: Never  Vaping Use   Vaping Use: Never used  Substance and Sexual Activity   Alcohol use: No   Drug use: No    Sexual activity: Not on file  Other Topics Concern   Not on file  Social History Narrative   Not on file   Social Determinants of Health   Financial Resource Strain: Not on file  Food Insecurity: Not on file  Transportation Needs: Not on file  Physical Activity: Not on file  Stress: Not on file  Social Connections: Not on file  Intimate Partner Violence: Not on file    Subjective: Review of Systems  Constitutional:  Negative for chills and fever.  HENT:  Negative for congestion and hearing loss.   Eyes:  Negative for blurred vision and double vision.  Respiratory:  Negative for cough and shortness of breath.   Cardiovascular:  Negative for chest pain and palpitations.  Gastrointestinal:  Negative for abdominal pain, blood in stool, constipation, diarrhea, heartburn, melena and vomiting.  Genitourinary:  Negative for dysuria and urgency.  Musculoskeletal:  Negative for joint pain and myalgias.  Skin:  Negative for itching and rash.  Neurological:  Negative for dizziness and headaches.  Psychiatric/Behavioral:  Negative for depression. The patient is not nervous/anxious.        Objective: BP 122/81 (BP Location: Left Arm, Patient Position: Sitting, Cuff Size: Large)   Pulse 81   Temp 98.3 F (36.8 C) (Oral)   Ht 5' 7"$  (1.702 m)   Wt 188 lb (85.3 kg)   BMI 29.44 kg/m  Physical Exam Constitutional:      Appearance: Normal appearance.  HENT:     Head: Normocephalic and atraumatic.  Eyes:     Extraocular Movements: Extraocular movements intact.     Conjunctiva/sclera: Conjunctivae normal.  Cardiovascular:     Rate and Rhythm: Normal rate. Rhythm irregular.  Pulmonary:     Effort: Pulmonary effort is normal.     Breath sounds: Normal breath sounds.  Abdominal:     General: Bowel sounds are normal.     Palpations: Abdomen is soft.  Musculoskeletal:        General: No swelling. Normal range of motion.     Cervical back: Normal range of motion and neck supple.   Skin:    General: Skin is warm and dry.     Coloration: Skin is not jaundiced.  Neurological:     General: No focal deficit present.     Mental Status: She is alert and oriented to person, place, and time.  Psychiatric:        Mood and Affect: Mood normal.        Behavior: Behavior normal.      Assessment: *Colon cancer screening  *Permanent atrial fibrillation *High risk medication use  Plan: Will schedule for screening colonoscopy.The risks including infection, bleed, or perforation as well as benefits, limitations, alternatives and imponderables have been reviewed with the patient. Questions have  been answered. All parties agreeable.  Patient will need to hold her Eliquis x 48 hours before procedure.  Discussed slight increased risk of patient having a stroke/cardiovascular event during this time with her today.  She understands and is agreeable.  Thank you Alvira Monday for the kind referral.  12/15/2022 9:50 AM   Disclaimer: This note was dictated with voice recognition software. Similar sounding words can inadvertently be transcribed and may not be corrected upon review.

## 2022-12-15 NOTE — H&P (View-Only) (Signed)
  Primary Care Physician:  Zarwolo, Gloria, FNP Primary Gastroenterologist:  Dr. Mystique Bjelland  Chief Complaint  Patient presents with   Colon Cancer Screening    Patient referred for colonoscopy. Reports she sometimes sees a little blood in her stool. Last colonoscopy per patient was 2013 in [place].     HPI:   Kayla Vaughn is a 81 y.o. female who presents to clinic to discuss screening colonoscopy. Last CLN was 2013 in [place].  Believes she may have had a few small polyps though unsure.  I do not have access to this report.  No family history of colorectal malignancy.  No melena hematochezia.  No abdominal pain.  No unintentional weight loss.  Denies any heartburn or reflux.  No epigastric or chest pain.  No dysphagia odynophagia.  Denies any constipation.  States her bowels move well overall.  Does have permanent atrial fibrillation.  Chronically rate controlled, takes Eliquis for stroke prophylaxis.  Sees cardiology yearly.  Nuclear med stress test 09/14/2021 normal.  2D echocardiogram 09/13/2021 with normal LVEF  Past Medical History:  Diagnosis Date   Atrial fibrillation ([facility])    Brain aneurysm 1996   Hyperlipemia    Hypertension    Motion sickness    boats, long car rides   Wears dentures    full upper    Past Surgical History:  Procedure Laterality Date   ABDOMINAL HYSTERECTOMY     BACK SURGERY     BRAIN SURGERY  1996   CATARACT EXTRACTION W/PHACO Right 02/06/2021   Procedure: CATARACT EXTRACTION PHACO AND INTRAOCULAR LENS PLACEMENT (IOC) RIGHT 5.43 01:08.1 8.0%;  Surgeon: Brasington, Chadwick, MD;  Location: [facility];  Service: Ophthalmology;  Laterality: Right;   CATARACT EXTRACTION W/PHACO Left 02/18/2021   Procedure: CATARACT EXTRACTION PHACO AND INTRAOCULAR LENS PLACEMENT (IOC) LEFT 8.25 01:13.2 11.2%;  Surgeon: Brasington, Chadwick, MD;  Location: [facility];  Service: Ophthalmology;  Laterality: Left;   CEREBRAL ANEURYSM REPAIR      CHOLECYSTECTOMY      Current Outpatient Medications  Medication Sig Dispense Refill   ALPRAZolam (XANAX) 0.25 MG tablet Take 0.25 mg by mouth. As needed when traveling.     amLODipine (NORVASC) 10 MG tablet Take 1 tablet (10 mg total) by mouth daily. 30 tablet 2   apixaban (ELIQUIS) 5 MG TABS tablet Take 1 tablet (5 mg total) by mouth 2 (two) times daily. 60 tablet 2   Brimonidine Tartrate-Timolol (COMBIGAN OP) Apply to eye. One drop both eyes in the mornings     cholecalciferol (VITAMIN D) 1000 units tablet Take 5,000 Units by mouth daily.     latanoprost (XALATAN) 0.005 % ophthalmic solution 1 drop at bedtime.     Multiple Vitamins-Minerals (WOMENS 50+ MULTI VITAMIN/MIN PO) Take by mouth.     nitroGLYCERIN (NITROSTAT) 0.4 MG SL tablet Place 1 tablet (0.4 mg total) under the tongue every 5 (five) minutes as needed for chest pain. 20 tablet 12   omega-3 acid ethyl esters (LOVAZA) 1 g capsule Take 1 g by mouth daily.     lisinopril (ZESTRIL) 20 MG tablet TAKE 1 TABLET BY MOUTH EVERY DAY 90 tablet 1   No current facility-administered medications for this visit.    Allergies as of 12/15/2022 - Review Complete 12/15/2022  Allergen Reaction Noted   Elemental sulfur  11/26/2017   Sulfa antibiotics Other (See Comments) 11/26/2017   Sulfur Other (See Comments) 11/26/2017    Family History  Problem Relation Age of Onset   Breast   cancer Maternal Grandmother     Social History   Socioeconomic History   Marital status: Legally Separated    Spouse name: Not on file   Number of children: Not on file   Years of education: Not on file   Highest education level: Not on file  Occupational History   Occupation: retired  Tobacco Use   Smoking status: Former    Types: Cigarettes    Quit date: 1994    Years since quitting: 30.1    Passive exposure: Past   Smokeless tobacco: Never  Vaping Use   Vaping Use: Never used  Substance and Sexual Activity   Alcohol use: No   Drug use: No    Sexual activity: Not on file  Other Topics Concern   Not on file  Social History Narrative   Not on file   Social Determinants of Health   Financial Resource Strain: Not on file  Food Insecurity: Not on file  Transportation Needs: Not on file  Physical Activity: Not on file  Stress: Not on file  Social Connections: Not on file  Intimate Partner Violence: Not on file    Subjective: Review of Systems  Constitutional:  Negative for chills and fever.  HENT:  Negative for congestion and hearing loss.   Eyes:  Negative for blurred vision and double vision.  Respiratory:  Negative for cough and shortness of breath.   Cardiovascular:  Negative for chest pain and palpitations.  Gastrointestinal:  Negative for abdominal pain, blood in stool, constipation, diarrhea, heartburn, melena and vomiting.  Genitourinary:  Negative for dysuria and urgency.  Musculoskeletal:  Negative for joint pain and myalgias.  Skin:  Negative for itching and rash.  Neurological:  Negative for dizziness and headaches.  Psychiatric/Behavioral:  Negative for depression. The patient is not nervous/anxious.        Objective: BP 122/81 (BP Location: Left Arm, Patient Position: Sitting, Cuff Size: Large)   Pulse 81   Temp 98.3 F (36.8 C) (Oral)   Ht 5' 7" (1.702 m)   Wt 188 lb (85.3 kg)   BMI 29.44 kg/m  Physical Exam Constitutional:      Appearance: Normal appearance.  HENT:     Head: Normocephalic and atraumatic.  Eyes:     Extraocular Movements: Extraocular movements intact.     Conjunctiva/sclera: Conjunctivae normal.  Cardiovascular:     Rate and Rhythm: Normal rate. Rhythm irregular.  Pulmonary:     Effort: Pulmonary effort is normal.     Breath sounds: Normal breath sounds.  Abdominal:     General: Bowel sounds are normal.     Palpations: Abdomen is soft.  Musculoskeletal:        General: No swelling. Normal range of motion.     Cervical back: Normal range of motion and neck supple.   Skin:    General: Skin is warm and dry.     Coloration: Skin is not jaundiced.  Neurological:     General: No focal deficit present.     Mental Status: She is alert and oriented to person, place, and time.  Psychiatric:        Mood and Affect: Mood normal.        Behavior: Behavior normal.      Assessment: *Colon cancer screening  *Permanent atrial fibrillation *High risk medication use  Plan: Will schedule for screening colonoscopy.The risks including infection, bleed, or perforation as well as benefits, limitations, alternatives and imponderables have been reviewed with the patient. Questions have   been answered. All parties agreeable.  Patient will need to hold her Eliquis x 48 hours before procedure.  Discussed slight increased risk of patient having a stroke/cardiovascular event during this time with her today.  She understands and is agreeable.  Thank you Gloria Zarwolo for the kind referral.  12/15/2022 9:50 AM   Disclaimer: This note was dictated with voice recognition software. Similar sounding words can inadvertently be transcribed and may not be corrected upon review.  

## 2022-12-16 ENCOUNTER — Encounter: Payer: Self-pay | Admitting: Internal Medicine

## 2022-12-17 DIAGNOSIS — H401123 Primary open-angle glaucoma, left eye, severe stage: Secondary | ICD-10-CM | POA: Diagnosis not present

## 2022-12-17 DIAGNOSIS — H401111 Primary open-angle glaucoma, right eye, mild stage: Secondary | ICD-10-CM | POA: Diagnosis not present

## 2022-12-23 ENCOUNTER — Other Ambulatory Visit: Payer: Self-pay | Admitting: Family Medicine

## 2022-12-23 ENCOUNTER — Encounter: Payer: Self-pay | Admitting: Radiology

## 2022-12-23 DIAGNOSIS — I10 Essential (primary) hypertension: Secondary | ICD-10-CM

## 2022-12-28 DIAGNOSIS — M79671 Pain in right foot: Secondary | ICD-10-CM | POA: Diagnosis not present

## 2022-12-28 DIAGNOSIS — M79674 Pain in right toe(s): Secondary | ICD-10-CM | POA: Diagnosis not present

## 2022-12-28 DIAGNOSIS — L600 Ingrowing nail: Secondary | ICD-10-CM | POA: Diagnosis not present

## 2022-12-28 DIAGNOSIS — L03031 Cellulitis of right toe: Secondary | ICD-10-CM | POA: Diagnosis not present

## 2023-01-06 ENCOUNTER — Other Ambulatory Visit: Payer: Self-pay

## 2023-01-06 ENCOUNTER — Encounter (HOSPITAL_COMMUNITY)
Admission: RE | Admit: 2023-01-06 | Discharge: 2023-01-06 | Disposition: A | Discharge status: Home / Self Care | Payer: Medicare Other | Source: Ambulatory Visit | Attending: Internal Medicine | Admitting: Internal Medicine

## 2023-01-06 ENCOUNTER — Encounter (HOSPITAL_COMMUNITY): Payer: Self-pay

## 2023-01-10 ENCOUNTER — Ambulatory Visit (HOSPITAL_BASED_OUTPATIENT_CLINIC_OR_DEPARTMENT_OTHER): Payer: Medicare Other | Admitting: Anesthesiology

## 2023-01-10 ENCOUNTER — Ambulatory Visit (HOSPITAL_COMMUNITY): Payer: Medicare Other | Admitting: Anesthesiology

## 2023-01-10 ENCOUNTER — Encounter (HOSPITAL_COMMUNITY)
Admission: RE | Disposition: A | Discharge status: Home / Self Care | Payer: Self-pay | Source: Home / Self Care | Attending: Internal Medicine

## 2023-01-10 ENCOUNTER — Ambulatory Visit (HOSPITAL_COMMUNITY)
Admission: RE | Admit: 2023-01-10 | Discharge: 2023-01-10 | Disposition: A | Discharge status: Home / Self Care | Payer: Medicare Other | Attending: Internal Medicine | Admitting: Internal Medicine

## 2023-01-10 DIAGNOSIS — I4821 Permanent atrial fibrillation: Secondary | ICD-10-CM | POA: Insufficient documentation

## 2023-01-10 DIAGNOSIS — K5730 Diverticulosis of large intestine without perforation or abscess without bleeding: Secondary | ICD-10-CM | POA: Diagnosis not present

## 2023-01-10 DIAGNOSIS — Z7901 Long term (current) use of anticoagulants: Secondary | ICD-10-CM | POA: Insufficient documentation

## 2023-01-10 DIAGNOSIS — I10 Essential (primary) hypertension: Secondary | ICD-10-CM | POA: Insufficient documentation

## 2023-01-10 DIAGNOSIS — Z87891 Personal history of nicotine dependence: Secondary | ICD-10-CM | POA: Diagnosis not present

## 2023-01-10 DIAGNOSIS — I4891 Unspecified atrial fibrillation: Secondary | ICD-10-CM

## 2023-01-10 DIAGNOSIS — Z1211 Encounter for screening for malignant neoplasm of colon: Secondary | ICD-10-CM | POA: Diagnosis not present

## 2023-01-10 HISTORY — PX: COLONOSCOPY WITH PROPOFOL: SHX5780

## 2023-01-10 SURGERY — COLONOSCOPY WITH PROPOFOL
Anesthesia: General

## 2023-01-10 MED ORDER — PROPOFOL 500 MG/50ML IV EMUL
INTRAVENOUS | Status: DC | PRN
  Administered 2023-01-10: 150 ug/kg/min via INTRAVENOUS

## 2023-01-10 MED ORDER — LACTATED RINGERS IV SOLN: INTRAVENOUS | Status: DC | PRN

## 2023-01-10 MED ORDER — LACTATED RINGERS IV SOLN: INTRAVENOUS | Status: DC

## 2023-01-10 MED ORDER — PROPOFOL 10 MG/ML IV BOLUS
INTRAVENOUS | Status: DC | PRN
  Administered 2023-01-10: 100 mg via INTRAVENOUS

## 2023-01-10 MED ORDER — LIDOCAINE HCL (CARDIAC) PF 100 MG/5ML IV SOSY
PREFILLED_SYRINGE | INTRAVENOUS | Status: DC | PRN
  Administered 2023-01-10: 60 mg via INTRATRACHEAL

## 2023-01-10 NOTE — Transfer of Care (Signed)
Immediate Anesthesia Transfer of Care Note  Patient: Kayla Vaughn  Procedure(s) Performed: COLONOSCOPY WITH PROPOFOL  Patient Location: Short Stay  Anesthesia Type:General  Level of Consciousness: sedated  Airway & Oxygen Therapy: Patient Spontanous Breathing  Post-op Assessment: Report given to RN and Post -op Vital signs reviewed and stable  Post vital signs: Reviewed and stable  Last Vitals:  Vitals Value Taken Time  BP 149/110 01/10/23 1030  Temp 36.5 C 01/10/23 1030  Pulse 61 01/10/23 1030  Resp 16   SpO2 93 % 01/10/23 1030    Last Pain:  Vitals:   01/10/23 1030  TempSrc: Axillary  PainSc:       Patients Stated Pain Goal: 5 ([id number])  Complications: No notable events documented.

## 2023-01-10 NOTE — Op Note (Signed)
[facility] Patient Name: Kayla Vaughn Procedure Date: 01/10/2023 9:55 AM MRN: [id number] Date of Birth: 1941-04-14 Attending MD: Elon Alas. Abbey Chatters , [place], [id number] CSN: [id number] Age: 82 Admit Type: Outpatient Procedure:                Colonoscopy Indications:              Screening for colorectal malignant neoplasm Providers:                Elon Alas. Abbey Chatters, DO, Jessica Boudreaux, Crystal                            Page Referring MD:              Medicines:                See the Anesthesia note for documentation of the                            administered medications Complications:            No immediate complications. Estimated Blood Loss:     Estimated blood loss: none. Procedure:                Pre-Anesthesia Assessment:                           - The anesthesia plan was to use monitored                            anesthesia care (MAC).                           After obtaining informed consent, the colonoscope                            was passed under direct vision. Throughout the                            procedure, the patient's blood pressure, pulse, and                            oxygen saturations were monitored continuously. The                            [id number]) scope was introduced through                            the anus and advanced to the the cecum, identified                            by appendiceal orifice and ileocecal valve. The                            colonoscopy was performed without difficulty. The                            patient tolerated the procedure well. The quality  of the bowel preparation was evaluated using the                            BBPS [facility] Bowel Preparation Scale) with scores                            of: Right Colon = 3, Transverse Colon = 3 and Left                            Colon = 3 (entire mucosa seen well with no residual                            staining, small  fragments of stool or opaque                            liquid). The total BBPS score equals 9. Scope In: 10:09:40 AM Scope Out: 10:27:52 AM Scope Withdrawal Time: 0 hours 10 minutes 59 seconds  Total Procedure Duration: 0 hours 18 minutes 12 seconds  Findings:      A few small-mouthed diverticula were found in the sigmoid colon.      The exam was otherwise without abnormality. Impression:               - Diverticulosis in the sigmoid colon.                           - The examination was otherwise normal.                           - No specimens collected. Moderate Sedation:      Per Anesthesia Care Recommendation:           - Patient has a contact number available for                            emergencies. The signs and symptoms of potential                            delayed complications were discussed with the                            patient. Return to normal activities tomorrow.                            Written discharge instructions were provided to the                            patient.                           - Resume previous diet.                           - Continue present medications.                           -  No repeat colonoscopy due to age.                           - Return to GI clinic PRN. Procedure Code(s):        --- Professional ---                           [id number], Colorectal cancer screening; colonoscopy on                            individual not meeting criteria for high risk Diagnosis Code(s):        --- Professional ---                           Z12.11, Encounter for screening for malignant                            neoplasm of colon                           K57.30, Diverticulosis of large intestine without                            perforation or abscess without bleeding CPT copyright 2022 American Medical Association. All rights reserved. The codes documented in this report are preliminary and upon coder review may  be revised to meet  current compliance requirements. Elon Alas. Abbey Chatters, DO [place] Abbey Chatters, DO 01/10/2023 10:30:13 AM This report has been signed electronically. Number of Addenda: 0

## 2023-01-10 NOTE — Discharge Instructions (Addendum)
  Colonoscopy Discharge Instructions  Read the instructions outlined below and refer to this sheet in the next few weeks. These discharge instructions provide you with general information on caring for yourself after you leave the hospital. Your doctor may also give you specific instructions. While your treatment has been planned according to the most current medical practices available, unavoidable complications occasionally occur.   ACTIVITY You may resume your regular activity, but move at a slower pace for the next 24 hours.  Take frequent rest periods for the next 24 hours.  Walking will help get rid of the air and reduce the bloated feeling in your belly (abdomen).  No driving for 24 hours (because of the medicine (anesthesia) used during the test).   Do not sign any important legal documents or operate any machinery for 24 hours (because of the anesthesia used during the test).  NUTRITION Drink plenty of fluids.  You may resume your normal diet as instructed by your doctor.  Begin with a light meal and progress to your normal diet. Heavy or fried foods are harder to digest and may make you feel sick to your stomach (nauseated).  Avoid alcoholic beverages for 24 hours or as instructed.  MEDICATIONS You may resume your normal medications unless your doctor tells you otherwise.  WHAT YOU CAN EXPECT TODAY Some feelings of bloating in the abdomen.  Passage of more gas than usual.  Spotting of blood in your stool or on the toilet paper.  IF YOU HAD POLYPS REMOVED DURING THE COLONOSCOPY: No aspirin products for 7 days or as instructed.  No alcohol for 7 days or as instructed.  Eat a soft diet for the next 24 hours.  FINDING OUT THE RESULTS OF YOUR TEST Not all test results are available during your visit. If your test results are not back during the visit, make an appointment with your caregiver to find out the results. Do not assume everything is normal if you have not heard from your  caregiver or the medical facility. It is important for you to follow up on all of your test results.  SEEK IMMEDIATE MEDICAL ATTENTION IF: You have more than a spotting of blood in your stool.  Your belly is swollen (abdominal distention).  You are nauseated or vomiting.  You have a temperature over 101.  You have abdominal pain or discomfort that is severe or gets worse throughout the day.   Your colonoscopy was relatively unremarkable.  I did not find any polyps or evidence of colon cancer.  Given your age I think it is reasonable to not perform further colonoscopy for colon cancer screening.  You do have mild diverticulosis.    I would recommend increasing fiber in your diet or adding OTC Benefiber/Metamucil. Be sure to drink at least 4 to 6 glasses of water daily. Follow-up with GI as needed.   I hope you have a great rest of your week!  Elon Alas. Abbey Chatters, D.O. Gastroenterology and Hepatology [facility]

## 2023-01-10 NOTE — Anesthesia Postprocedure Evaluation (Signed)
Anesthesia Post Note  Patient: Kayla Vaughn  Procedure(s) Performed: COLONOSCOPY WITH PROPOFOL  Patient location during evaluation: Phase II Anesthesia Type: General Level of consciousness: awake and alert and oriented Pain management: pain level controlled Vital Signs Assessment: post-procedure vital signs reviewed and stable Respiratory status: spontaneous breathing, nonlabored ventilation and respiratory function stable Cardiovascular status: blood pressure returned to baseline and stable Postop Assessment: no apparent nausea or vomiting Anesthetic complications: no  No notable events documented.   Last Vitals:  Vitals:   01/10/23 1040 01/10/23 1042  BP: 93/60 91/61  Pulse:    Resp:    Temp:    SpO2:      Last Pain:  Vitals:   01/10/23 1036  TempSrc:   PainSc: 0-No pain                 Aanchal Cope C Toniqua Melamed

## 2023-01-10 NOTE — Interval H&P Note (Signed)
History and Physical Interval Note:  01/10/2023 9:41 AM  Kayla Vaughn  has presented today for surgery, with the diagnosis of SCREENING.  The various methods of treatment have been discussed with the patient and family. After consideration of risks, benefits and other options for treatment, the patient has consented to  Procedure(s) with comments: COLONOSCOPY WITH PROPOFOL (N/A) - 10:00 AM, asa 3 as a surgical intervention.  The patient's history has been reviewed, patient examined, no change in status, stable for surgery.  I have reviewed the patient's chart and labs.  Questions were answered to the patient's satisfaction.     Eloise Harman

## 2023-01-10 NOTE — Anesthesia Preprocedure Evaluation (Signed)
Anesthesia Evaluation  Patient identified by MRN, date of birth, ID band Patient awake    Reviewed: Allergy & Precautions, H&P , NPO status , Patient's Chart, lab work & pertinent test results  History of Anesthesia Complications (+) PONV and history of anesthetic complications  Airway Mallampati: II  TM Distance: >3 FB Neck ROM: Full    Dental  (+) Dental Advisory Given, Upper Dentures   Pulmonary shortness of breath and with exertion, former smoker   Pulmonary exam normal breath sounds clear to auscultation       Cardiovascular Exercise Tolerance: Good hypertension, Pt. on medications + dysrhythmias Atrial Fibrillation  Rhythm:Irregular Rate:Normal + Systolic murmurs 1. Left ventricular ejection fraction, by estimation, is 60 to 65%. The  left ventricle has normal function. The left ventricle has no regional  wall motion abnormalities. There is moderate left ventricular hypertrophy.  Left ventricular diastolic  parameters are indeterminate.   2. Right ventricular systolic function was not well visualized. The right  ventricular size is mildly enlarged.   3. Right atrial size was mildly dilated.   4. The mitral valve is normal in structure. No evidence of mitral valve  regurgitation. No evidence of mitral stenosis. Moderate mitral annular  calcification.   5. The aortic valve is normal in structure. Aortic valve regurgitation is  not visualized. No aortic stenosis is present.   6. The inferior vena cava is normal in size with greater than 50%  respiratory variability, suggesting right atrial pressure of 3 mmHg.      Neuro/Psych  PSYCHIATRIC DISORDERS Anxiety     Cerebral aneurysm repair TIA (denied h/o TIA)   GI/Hepatic negative GI ROS, Neg liver ROS,,,  Endo/Other  negative endocrine ROS    Renal/GU Renal disease (AKI)  negative genitourinary   Musculoskeletal negative musculoskeletal ROS (+)    Abdominal    Peds negative pediatric ROS (+)  Hematology negative hematology ROS (+)   Anesthesia Other Findings   Reproductive/Obstetrics negative OB ROS                             Anesthesia Physical Anesthesia Plan  ASA: 3  Anesthesia Plan: General   Post-op Pain Management: Minimal or no pain anticipated   Induction: Intravenous  PONV Risk Score and Plan: 1 and Propofol infusion  Airway Management Planned: Nasal Cannula and Natural Airway  Additional Equipment:   Intra-op Plan:   Post-operative Plan:   Informed Consent: I have reviewed the patients History and Physical, chart, labs and discussed the procedure including the risks, benefits and alternatives for the proposed anesthesia with the patient or authorized representative who has indicated his/her understanding and acceptance.     Dental advisory given  Plan Discussed with: CRNA and Surgeon  Anesthesia Plan Comments:         Anesthesia Quick Evaluation

## 2023-01-17 ENCOUNTER — Telehealth: Payer: Self-pay | Admitting: Family Medicine

## 2023-01-17 ENCOUNTER — Other Ambulatory Visit: Payer: Self-pay | Admitting: Family Medicine

## 2023-01-17 DIAGNOSIS — I482 Chronic atrial fibrillation, unspecified: Secondary | ICD-10-CM

## 2023-01-17 MED ORDER — APIXABAN 5 MG PO TABS: 5.0000 mg | ORAL_TABLET | Freq: Two times a day (BID) | ORAL | 1 refills | Status: DC

## 2023-01-17 NOTE — Telephone Encounter (Signed)
Prescription Request  01/17/2023  LOV: 08/17/2022  What is the name of the medication or equipment? apixaban (ELIQUIS) 5 MG TABS tablet   Have you contacted your pharmacy to request a refill? Yes  PHAR IS TELLING HER THEY DO NOT HAVE A RX FOR THIS  & SHE IS ALMOST OUT  Which pharmacy would you like this sent to?  [id number] [place], [place] - [address] [address] Phone: [phone] Fax: [phone]    Patient notified that their request is being sent to the clinical staff for review and that they should receive a response within 2 business days.   Please advise at [facility] [phone]

## 2023-01-17 NOTE — Telephone Encounter (Signed)
Seems like the med was ordered on 01/04/2023

## 2023-01-17 NOTE — Telephone Encounter (Signed)
Rx sent to the pharmacy.

## 2023-01-19 ENCOUNTER — Encounter (HOSPITAL_COMMUNITY): Payer: Self-pay | Admitting: Internal Medicine

## 2023-01-27 DIAGNOSIS — L814 Other melanin hyperpigmentation: Secondary | ICD-10-CM | POA: Diagnosis not present

## 2023-01-27 DIAGNOSIS — M79672 Pain in left foot: Secondary | ICD-10-CM | POA: Diagnosis not present

## 2023-01-27 DIAGNOSIS — L110 Acquired keratosis follicularis: Secondary | ICD-10-CM | POA: Diagnosis not present

## 2023-01-27 DIAGNOSIS — M79675 Pain in left toe(s): Secondary | ICD-10-CM | POA: Diagnosis not present

## 2023-01-27 DIAGNOSIS — L720 Epidermal cyst: Secondary | ICD-10-CM | POA: Diagnosis not present

## 2023-02-22 ENCOUNTER — Encounter: Payer: Self-pay | Admitting: Family Medicine

## 2023-02-22 ENCOUNTER — Ambulatory Visit (INDEPENDENT_AMBULATORY_CARE_PROVIDER_SITE_OTHER): Payer: Medicare Other | Admitting: Family Medicine

## 2023-02-22 VITALS — BP 130/85 | HR 74 | Ht 67.0 in | Wt 190.0 lb

## 2023-02-22 DIAGNOSIS — M25551 Pain in right hip: Secondary | ICD-10-CM | POA: Diagnosis not present

## 2023-02-22 DIAGNOSIS — L309 Dermatitis, unspecified: Secondary | ICD-10-CM | POA: Insufficient documentation

## 2023-02-22 DIAGNOSIS — E038 Other specified hypothyroidism: Secondary | ICD-10-CM

## 2023-02-22 DIAGNOSIS — G8929 Other chronic pain: Secondary | ICD-10-CM

## 2023-02-22 DIAGNOSIS — E559 Vitamin D deficiency, unspecified: Secondary | ICD-10-CM | POA: Diagnosis not present

## 2023-02-22 DIAGNOSIS — R7301 Impaired fasting glucose: Secondary | ICD-10-CM

## 2023-02-22 DIAGNOSIS — E7849 Other hyperlipidemia: Secondary | ICD-10-CM | POA: Diagnosis not present

## 2023-02-22 MED ORDER — TRIAMCINOLONE ACETONIDE 0.1 % EX CREA: 1.0000 | TOPICAL_CREAM | Freq: Every day | CUTANEOUS | 0 refills | Status: DC

## 2023-02-22 MED ORDER — KETOCONAZOLE 2 % EX CREA
1.0000 | TOPICAL_CREAM | Freq: Every day | CUTANEOUS | 0 refills | Status: DC
Start: 2023-02-22 — End: 2023-11-17

## 2023-02-22 MED ORDER — DULOXETINE HCL 30 MG PO CPEP
30.0000 mg | ORAL_CAPSULE | Freq: Every day | ORAL | 3 refills | Status: DC
Start: 2023-02-22 — End: 2023-03-22

## 2023-02-22 NOTE — Progress Notes (Signed)
Established Patient Office Visit  Subjective:  Patient ID: Kayla Vaughn, female    DOB: 10-Jun-1941  Age: 82 y.o. MRN: [id number]  CC:  Chief Complaint  Patient presents with   Hip Pain    Pt reports back and hip pain mainly in hip. Has been going on since 6-8 months ago, had a fall last year that made it worse.    Rash    Pt reports rash on her right chest area since 4 weeks ago. 01/25/2023.    HPI Kayla Vaughn is a 82 y.o. female with past medical history of chronic hip pain, hypertension, hyperlipidemia presents for f/u of  chronic medical conditions. For the details of today's visit, please refer to the assessment and plan.     Past Medical History:  Diagnosis Date   Atrial fibrillation (HCC)    Brain aneurysm 1996   Hyperlipemia    Hypertension    Motion sickness    boats, long car rides   Wears dentures    full upper    Past Surgical History:  Procedure Laterality Date   ABDOMINAL HYSTERECTOMY     BACK SURGERY     x2   BRAIN SURGERY  1996   CATARACT EXTRACTION W/PHACO Right 02/06/2021   Procedure: CATARACT EXTRACTION PHACO AND INTRAOCULAR LENS PLACEMENT (IOC) RIGHT 5.43 01:08.1 8.0%;  Surgeon: Lockie Mola, MD;  Location: [facility];  Service: Ophthalmology;  Laterality: Right;   CATARACT EXTRACTION W/PHACO Left 02/18/2021   Procedure: CATARACT EXTRACTION PHACO AND INTRAOCULAR LENS PLACEMENT (IOC) LEFT 8.25 01:13.2 11.2%;  Surgeon: Lockie Mola, MD;  Location: [facility];  Service: Ophthalmology;  Laterality: Left;   CEREBRAL ANEURYSM REPAIR     CHOLECYSTECTOMY     COLONOSCOPY WITH PROPOFOL N/A 01/10/2023   Procedure: COLONOSCOPY WITH PROPOFOL;  Surgeon: Lanelle Bal, DO;  [facility];  Service: Endoscopy;  Laterality: N/A;  10:00 AM, asa 3    Family History  Problem Relation Age of Onset   Breast cancer Maternal Grandmother     Social History   Socioeconomic History   Marital status: Legally Separated     Spouse name: Not on file   Number of children: Not on file   Years of education: Not on file   Highest education level: Not on file  Occupational History   Occupation: retired  Tobacco Use   Smoking status: Former    Packs/day: 0.25    Years: 20.00    Additional pack years: 0.00    Total pack years: 5.00    Types: Cigarettes    Quit date: 1994    Years since quitting: 30.3    Passive exposure: Past   Smokeless tobacco: Never  Vaping Use   Vaping Use: Never used  Substance and Sexual Activity   Alcohol use: No   Drug use: No   Sexual activity: Not on file  Other Topics Concern   Not on file  Social History Narrative   Not on file   Social Determinants of Health   Financial Resource Strain: Not on file  Food Insecurity: Not on file  Transportation Needs: Not on file  Physical Activity: Not on file  Stress: Not on file  Social Connections: Not on file  Intimate Partner Violence: Not on file    Outpatient Medications Prior to Visit  Medication Sig Dispense Refill   ALPRAZolam (XANAX) 0.25 MG tablet Take 0.25 mg by mouth daily as needed for anxiety (traveling).  amLODipine (NORVASC) 10 MG tablet TAKE 1 TABLET BY MOUTH EVERY DAY 90 tablet 1   apixaban (ELIQUIS) 5 MG TABS tablet Take 1 tablet (5 mg total) by mouth 2 (two) times daily. 60 tablet 1   Cholecalciferol (VITAMIN D) 125 MCG (5000 UT) CAPS Take 5,000 Units by mouth daily.     COMBIGAN 0.2-0.5 % ophthalmic solution Place 1 drop into both eyes every morning.     latanoprost (XALATAN) 0.005 % ophthalmic solution Place 1 drop into both eyes at bedtime.     lisinopril (ZESTRIL) 20 MG tablet TAKE 1 TABLET BY MOUTH EVERY DAY 90 tablet 1   Multiple Vitamins-Minerals (WOMENS 50+ MULTI VITAMIN/MIN PO) Take 1 tablet by mouth daily.     Omega-3 Fatty Acids (FISH OIL) 1000 MG CAPS Take 1,000 mg by mouth daily.     nitroGLYCERIN (NITROSTAT) 0.4 MG SL tablet Place 1 tablet (0.4 mg total) under the tongue every 5 (five)  minutes as needed for chest pain. 20 tablet 12   No facility-administered medications prior to visit.    Allergies  Allergen Reactions   Sulfa Antibiotics Other (See Comments)    Pt does not remember reaction     ROS Review of Systems  Constitutional:  Negative for chills and fever.  Eyes:  Negative for visual disturbance.  Respiratory:  Negative for chest tightness and shortness of breath.   Musculoskeletal:  Positive for arthralgias.  Skin:  Positive for rash.  Neurological:  Negative for dizziness and headaches.      Objective:    Physical Exam HENT:     Head: Normocephalic.     Mouth/Throat:     Mouth: Mucous membranes are moist.  Cardiovascular:     Rate and Rhythm: Normal rate.     Heart sounds: Normal heart sounds.  Pulmonary:     Effort: Pulmonary effort is normal.     Breath sounds: Normal breath sounds.  Musculoskeletal:     Right hip: Tenderness (femoral crease) present. Decreased range of motion.     Left hip: Decreased range of motion.     Comments: Limping while ambulating Limited range of motion with abduction and internal rotation of the right hip  Skin:    Findings: Rash (hypopigmented patches noted the anterior upper chest) present.  Neurological:     Mental Status: She is alert.     BP 130/85   Pulse 74   Ht 5\' 7"  (1.702 m)   Wt 190 lb (86.2 kg)   SpO2 94%   BMI 29.76 kg/m  Wt Readings from Last 3 Encounters:  02/22/23 190 lb (86.2 kg)  01/06/23 188 lb 0.8 oz (85.3 kg)  12/15/22 188 lb (85.3 kg)    Lab Results  Component Value Date   TSH 3.150 08/04/2022   Lab Results  Component Value Date   WBC 2.8 (L) 06/04/2022   HGB 13.1 06/04/2022   HCT 41.5 06/04/2022   MCV 87 06/04/2022   PLT 237 06/04/2022   Lab Results  Component Value Date   NA 142 06/04/2022   K 4.0 06/04/2022   CO2 22 06/04/2022   GLUCOSE 95 06/04/2022   BUN 15 06/04/2022   CREATININE 1.28 (H) 06/04/2022   BILITOT 0.4 06/04/2022   ALKPHOS 69 06/04/2022    AST 16 06/04/2022   ALT 15 06/04/2022   PROT 6.4 06/04/2022   ALBUMIN 4.1 06/04/2022   CALCIUM 9.8 06/04/2022   ANIONGAP 8 10/05/2021   EGFR 43 (L) 06/04/2022   Lab  Results  Component Value Date   CHOL 186 06/04/2022   Lab Results  Component Value Date   HDL 47 06/04/2022   Lab Results  Component Value Date   LDLCALC 123 (H) 06/04/2022   Lab Results  Component Value Date   TRIG 90 06/04/2022   Lab Results  Component Value Date   CHOLHDL 4.0 06/04/2022   Lab Results  Component Value Date   HGBA1C 6.1 (H) 06/04/2022      Assessment & Plan:  Chronic right hip pain Assessment & Plan: Chronic condition Complains of joint pain, stiffness, and decreased range of motion of the right hip 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 Decreased internal rotation, hip flexion and abduction of the right hip Complains of groin pain Patient is limping while ambulating Reports worsening of her symptoms since falling backward in her bathtub a year ago No recent falls or injury reported No systemic symptoms reported No recent falls or injury She takes over-the-counter Tylenol as needed for pain control Will treat with duloxetine 60 mg daily Referral placed to orthopedic surgery Will get imaging studies of the right hip Declines physical therapy currently  Orders: -     DG Arthro Hip Right -     Ambulatory referral to Orthopedic Surgery -     DULoxetine HCl; Take 1 capsule (30 mg total) by mouth daily.  Dispense: 30 capsule; Refill: 3  Dermatitis Assessment & Plan: Likely of tinea vesicolor Complains of mild pruritus She reports applying toothpaste with relief of her symptoms Onset of rash 2 to 3 weeks ago We will treat today with ketoconazole 2% cream  Orders: -     Ketoconazole; Apply 1 Application topically daily.  Dispense: 15 g; Refill: 0  IFG (impaired fasting glucose) -     Hemoglobin A1c  Vitamin D deficiency -     VITAMIN D 25 Hydroxy (Vit-D  Deficiency, Fractures)  Other specified hypothyroidism -     TSH + free T4  Other hyperlipidemia -     Lipid panel -     CMP14+EGFR -     CBC with Differential/Platelet    Follow-up: Return in about 3 months (around 05/24/2023).   Gilmore Laroche, FNP

## 2023-02-22 NOTE — Assessment & Plan Note (Signed)
Likely of tinea vesicolor Complains of mild pruritus She reports applying toothpaste with relief of her symptoms Onset of rash 2 to 3 weeks ago We will treat today with ketoconazole 2% cream

## 2023-02-22 NOTE — Assessment & Plan Note (Addendum)
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

## 2023-02-22 NOTE — Patient Instructions (Addendum)
I appreciate the opportunity to provide care to you today!    Follow up:  3 months  Labs: please stop by the lab during the week to get your blood drawn (CBC, CMP, TSH, Lipid profile, HgA1c, Vit D  Please pick up your prescription at the pharmacy   Please stop by [facility] hospital anytime to get an x-ray of the right hip   Referrals today-  orthopedics surgery   Please continue to a heart-healthy diet and increase your physical activities. Try to exercise for at least five days a week.      It was a pleasure to see you and I look forward to continuing to work together on your health and well-being. Please do not hesitate to call the office if you need care or have questions about your care.   Have a wonderful day and week. With Gratitude, Gilmore Laroche MSN, FNP-BC

## 2023-03-01 DIAGNOSIS — Z961 Presence of intraocular lens: Secondary | ICD-10-CM | POA: Diagnosis not present

## 2023-03-07 ENCOUNTER — Ambulatory Visit: Payer: Medicare Other | Admitting: Orthopedic Surgery

## 2023-03-07 ENCOUNTER — Other Ambulatory Visit (INDEPENDENT_AMBULATORY_CARE_PROVIDER_SITE_OTHER): Payer: Medicare Other

## 2023-03-07 ENCOUNTER — Encounter: Payer: Self-pay | Admitting: Orthopedic Surgery

## 2023-03-07 VITALS — BP 125/69 | HR 57 | Ht 67.0 in | Wt 185.0 lb

## 2023-03-07 DIAGNOSIS — M5137 Other intervertebral disc degeneration, lumbosacral region: Secondary | ICD-10-CM | POA: Diagnosis not present

## 2023-03-07 DIAGNOSIS — M25551 Pain in right hip: Secondary | ICD-10-CM

## 2023-03-07 DIAGNOSIS — M5441 Lumbago with sciatica, right side: Secondary | ICD-10-CM

## 2023-03-07 DIAGNOSIS — M1611 Unilateral primary osteoarthritis, right hip: Secondary | ICD-10-CM

## 2023-03-07 MED ORDER — GABAPENTIN 100 MG PO CAPS
100.0000 mg | ORAL_CAPSULE | Freq: Every day | ORAL | 0 refills | Status: DC
Start: 2023-03-07 — End: 2023-07-11

## 2023-03-07 MED ORDER — TIZANIDINE HCL 4 MG PO TABS
4.0000 mg | ORAL_TABLET | Freq: Every day | ORAL | 1 refills | Status: DC
Start: 2023-03-07 — End: 2023-03-22

## 2023-03-07 NOTE — Patient Instructions (Addendum)
Your hip xray looks good you have mild arthritis but should not need any hip replacement surgery  Limited treatment available for your back arthritis because you are using a blood thinner  Physical therapy has been ordered for you at [facility] they will call you with an appointment   Take 500 mg of Tylenol every 6 hours for pain Take the muscle relaxer in the evening Take the gabapentin in the evening  Return as needed

## 2023-03-07 NOTE — Progress Notes (Signed)
Patient ID: Kayla Vaughn, female   DOB: [birth year], 82 y.o.   MRN: [id number]  Office Visit Note   Patient: Kayla Vaughn           Date of Birth: [birth year]           MRN: [id number] Visit Date: 03/07/2023 Requested by: Gilmore Laroche, FNP [address] [place],  [place] [address] PCP: Gilmore Laroche, FNP  Assessment & Plan:  Chronic problem with exacerbation of symptoms likely to be present for more than a year  Images personally read and my interpretation : Plain films lumbar spine show degenerative disc changes at the endplates hint of spondylolisthesis L5-S1  Also have mild OA grade one of the right hip joint  Visit Diagnoses:  1. DDD (degenerative disc disease), lumbosacral   2. Pain in right hip   3. Right-sided low back pain with right-sided sciatica, unspecified chronicity   4. Arthritis of right hip     Plan: Recommend  1 physical therapy lumbar spine 2 medications as listed below  Follow-Up Instructions: Return if symptoms worsen or fail to improve.   Orders:  Meds ordered this encounter  Medications   gabapentin (NEURONTIN) 100 MG capsule    Sig: Take 1 capsule (100 mg total) by mouth at bedtime.    Dispense:  42 capsule    Refill:  0   tiZANidine (ZANAFLEX) 4 MG tablet    Sig: Take 1 tablet (4 mg total) by mouth daily.    Dispense:  30 tablet    Refill:  1     Chief Complaint  Patient presents with   Back Pain    Right side back pain and into right groin multiple falls  History of 2 back surgery 1983 and 1986 fell in 2017    HPI Kayla Vaughn is a 82 y.o. female.  82 year old female status post surgery mid to late 80s did well recovered from her back surgery comes in with right hip and lower back pain.  The pain in her hip is in the groin the pain in her back is across the back at the belt line and also in the right buttock  She says by the end of the day she is hunched over in the flexed position to try to get relief no radicular symptoms no  bowel or bladder dysfunction  Allergies  Allergen Reactions   Sulfa Antibiotics Other (See Comments)    Pt does not remember reaction    Current Outpatient Medications  Medication Instructions   ALPRAZolam (XANAX) 0.25 mg, Oral, Daily PRN   amLODipine (NORVASC) 10 mg, Oral, Daily   apixaban (ELIQUIS) 5 mg, Oral, 2 times daily   COMBIGAN 0.2-0.5 % ophthalmic solution 1 drop, Both Eyes, BH-each morning   DULoxetine (CYMBALTA) 30 mg, Oral, Daily   Fish Oil 1,000 mg, Oral, Daily   gabapentin (NEURONTIN) 100 mg, Oral, Daily at bedtime   ketoconazole (NIZORAL) 2 % cream 1 Application, Topical, Daily   latanoprost (XALATAN) 0.005 % ophthalmic solution 1 drop, Both Eyes, Daily at bedtime   lisinopril (ZESTRIL) 20 mg, Oral, Daily   Multiple Vitamins-Minerals (WOMENS 50+ MULTI VITAMIN/MIN PO) 1 tablet, Oral, Daily   tiZANidine (ZANAFLEX) 4 mg, Oral, Daily   Vitamin D 5,000 Units, Oral, Daily    Review of Systems Review of Systems  Constitutional:  Negative for fever and unexpected weight change.  Gastrointestinal: Negative.   Genitourinary: Negative.   Neurological:  Negative for numbness.  Past Medical History:  Diagnosis Date   Atrial fibrillation (HCC)    Brain aneurysm 1996   Hyperlipemia    Hypertension    Motion sickness    boats, long car rides   Wears dentures    full upper    Past Surgical History:  Procedure Laterality Date   ABDOMINAL HYSTERECTOMY     BACK SURGERY     x2   BRAIN SURGERY  1996   CATARACT EXTRACTION W/PHACO Right 02/06/2021   Procedure: CATARACT EXTRACTION PHACO AND INTRAOCULAR LENS PLACEMENT (IOC) RIGHT 5.43 01:08.1 8.0%;  Surgeon: Lockie Mola, MD;  Location: [facility];  Service: Ophthalmology;  Laterality: Right;   CATARACT EXTRACTION W/PHACO Left 02/18/2021   Procedure: CATARACT EXTRACTION PHACO AND INTRAOCULAR LENS PLACEMENT (IOC) LEFT 8.25 01:13.2 11.2%;  Surgeon: Lockie Mola, MD;  Location: [facility];   Service: Ophthalmology;  Laterality: Left;   CEREBRAL ANEURYSM REPAIR     CHOLECYSTECTOMY     COLONOSCOPY WITH PROPOFOL N/A 01/10/2023   Procedure: COLONOSCOPY WITH PROPOFOL;  Surgeon: Lanelle Bal, DO;  [facility];  Service: Endoscopy;  Laterality: N/A;  10:00 AM, asa 3    Family History  Problem Relation Age of Onset   Breast cancer Maternal Grandmother    was reviewed  Social History Social History   Tobacco Use   Smoking status: Former    Packs/day: 0.25    Years: 20.00    Additional pack years: 0.00    Total pack years: 5.00    Types: Cigarettes    Quit date: 1994    Years since quitting: 30.3    Passive exposure: Past   Smokeless tobacco: Never  Vaping Use   Vaping Use: Never used  Substance Use Topics   Alcohol use: No   Drug use: No    Allergies  Allergen Reactions   Sulfa Antibiotics Other (See Comments)    Pt does not remember reaction     Current Outpatient Medications  Medication Sig Dispense Refill   ALPRAZolam (XANAX) 0.25 MG tablet Take 0.25 mg by mouth daily as needed for anxiety (traveling).     amLODipine (NORVASC) 10 MG tablet TAKE 1 TABLET BY MOUTH EVERY DAY 90 tablet 1   apixaban (ELIQUIS) 5 MG TABS tablet Take 1 tablet (5 mg total) by mouth 2 (two) times daily. 60 tablet 1   Cholecalciferol (VITAMIN D) 125 MCG (5000 UT) CAPS Take 5,000 Units by mouth daily.     COMBIGAN 0.2-0.5 % ophthalmic solution Place 1 drop into both eyes every morning.     DULoxetine (CYMBALTA) 30 MG capsule Take 1 capsule (30 mg total) by mouth daily. 30 capsule 3   gabapentin (NEURONTIN) 100 MG capsule Take 1 capsule (100 mg total) by mouth at bedtime. 42 capsule 0   ketoconazole (NIZORAL) 2 % cream Apply 1 Application topically daily. 15 g 0   latanoprost (XALATAN) 0.005 % ophthalmic solution Place 1 drop into both eyes at bedtime.     lisinopril (ZESTRIL) 20 MG tablet TAKE 1 TABLET BY MOUTH EVERY DAY 90 tablet 1   Multiple Vitamins-Minerals (WOMENS  50+ MULTI VITAMIN/MIN PO) Take 1 tablet by mouth daily.     Omega-3 Fatty Acids (FISH OIL) 1000 MG CAPS Take 1,000 mg by mouth daily.     tiZANidine (ZANAFLEX) 4 MG tablet Take 1 tablet (4 mg total) by mouth daily. 30 tablet 1   No current facility-administered medications for this visit.     Physical Exam  BP 125/69   Pulse (!) 57   Ht 5\' 7"  (1.702 m)   Wt 185 lb (83.9 kg)   BMI 28.98 kg/m   Gen. appearance: The patient is well-developed and well-nourished grooming and hygiene are normal The patient is oriented to person place and time The patient's mood is normal and the affect is normal   Gait assessment: The patient stands with  normal gait and station  Lumbar spine Tenderness  to palpation is noted in the lower L4-5 and 5 S1 segment  Range of motion flexion is good extension is painful Muscle tone   increased on the right and left sides of the spine  Lower extremities right and left Normal range of motion hip  All 3 joints are reduced and stable  Strength right lower extremity L2-S1 NORMAL EXCEPT  Strength left lower extremity L2-S1 NORMAL EXCEPT    Neurologic right lower extremity examination  Reflexes were 2+ and equal at the knee and 1+ and equal at the ankle    Sensation was normal in both feet and legs    Babinski's tests were down going  Straight leg raise testing   The vascular examination revealed normal dorsalis pedis pulses in both feet and both feet were warm with good capillary refill

## 2023-03-12 ENCOUNTER — Other Ambulatory Visit: Payer: Self-pay | Admitting: Family Medicine

## 2023-03-12 DIAGNOSIS — I482 Chronic atrial fibrillation, unspecified: Secondary | ICD-10-CM

## 2023-03-20 ENCOUNTER — Other Ambulatory Visit: Payer: Self-pay | Admitting: Family Medicine

## 2023-03-20 DIAGNOSIS — G8929 Other chronic pain: Secondary | ICD-10-CM

## 2023-03-22 ENCOUNTER — Other Ambulatory Visit: Payer: Self-pay | Admitting: Orthopedic Surgery

## 2023-03-22 DIAGNOSIS — M5137 Other intervertebral disc degeneration, lumbosacral region: Secondary | ICD-10-CM

## 2023-03-22 DIAGNOSIS — M51379 Other intervertebral disc degeneration, lumbosacral region without mention of lumbar back pain or lower extremity pain: Secondary | ICD-10-CM

## 2023-04-15 ENCOUNTER — Other Ambulatory Visit: Payer: Self-pay | Admitting: Family Medicine

## 2023-04-15 DIAGNOSIS — I10 Essential (primary) hypertension: Secondary | ICD-10-CM

## 2023-04-18 DIAGNOSIS — Z961 Presence of intraocular lens: Secondary | ICD-10-CM | POA: Diagnosis not present

## 2023-04-18 DIAGNOSIS — H401111 Primary open-angle glaucoma, right eye, mild stage: Secondary | ICD-10-CM | POA: Diagnosis not present

## 2023-04-18 DIAGNOSIS — H401123 Primary open-angle glaucoma, left eye, severe stage: Secondary | ICD-10-CM | POA: Diagnosis not present

## 2023-04-24 IMAGING — CR DG CHEST 2V
1 series · 2 of 2 positions shown · non-contrast
Comparison: None.

CLINICAL DATA: Atrial fibrillation

EXAM:
CHEST - 2 VIEW

[Series 1: w chest pa · 0.14mm/px · 2 of 2 slices shown]
[im 1/2]
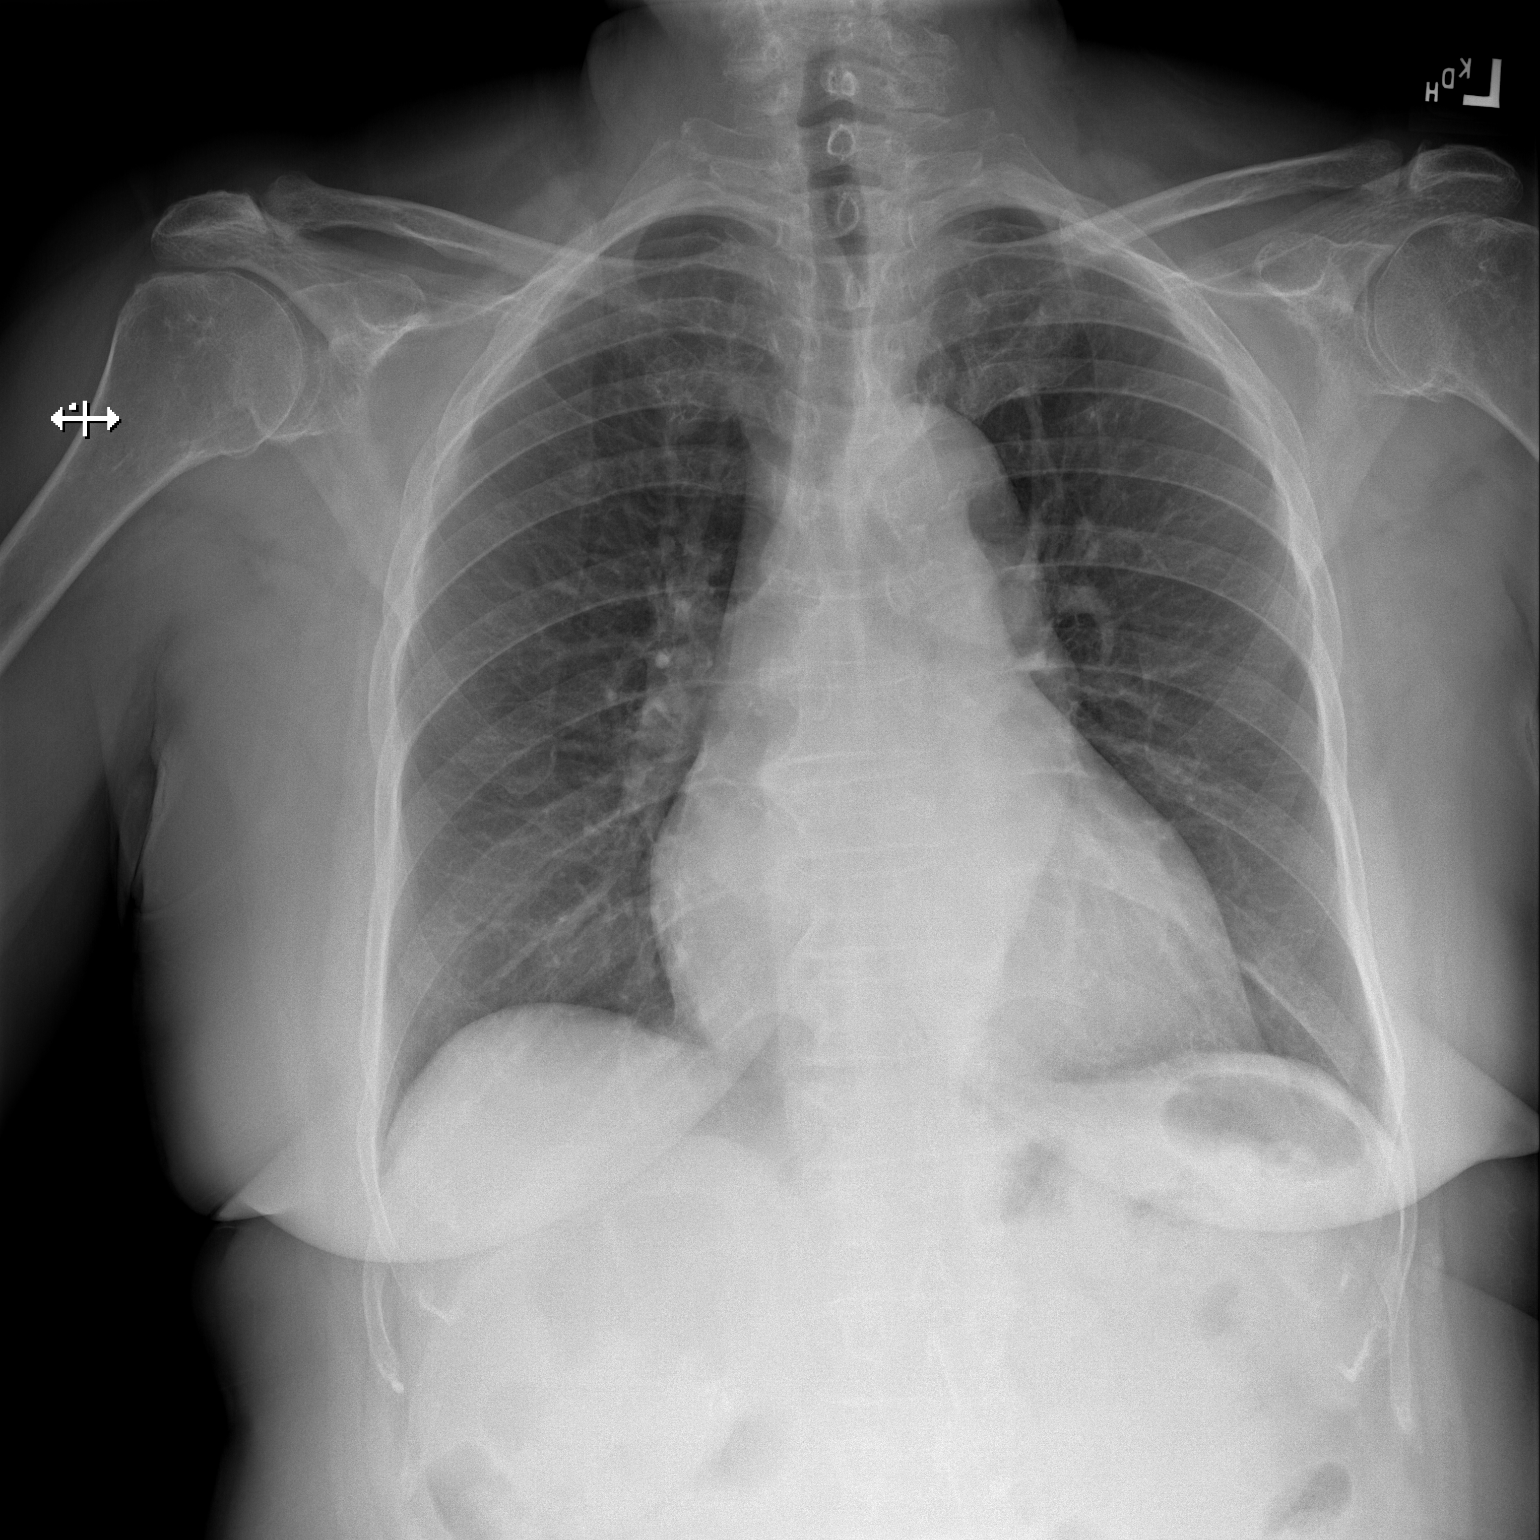
[im 2/2]
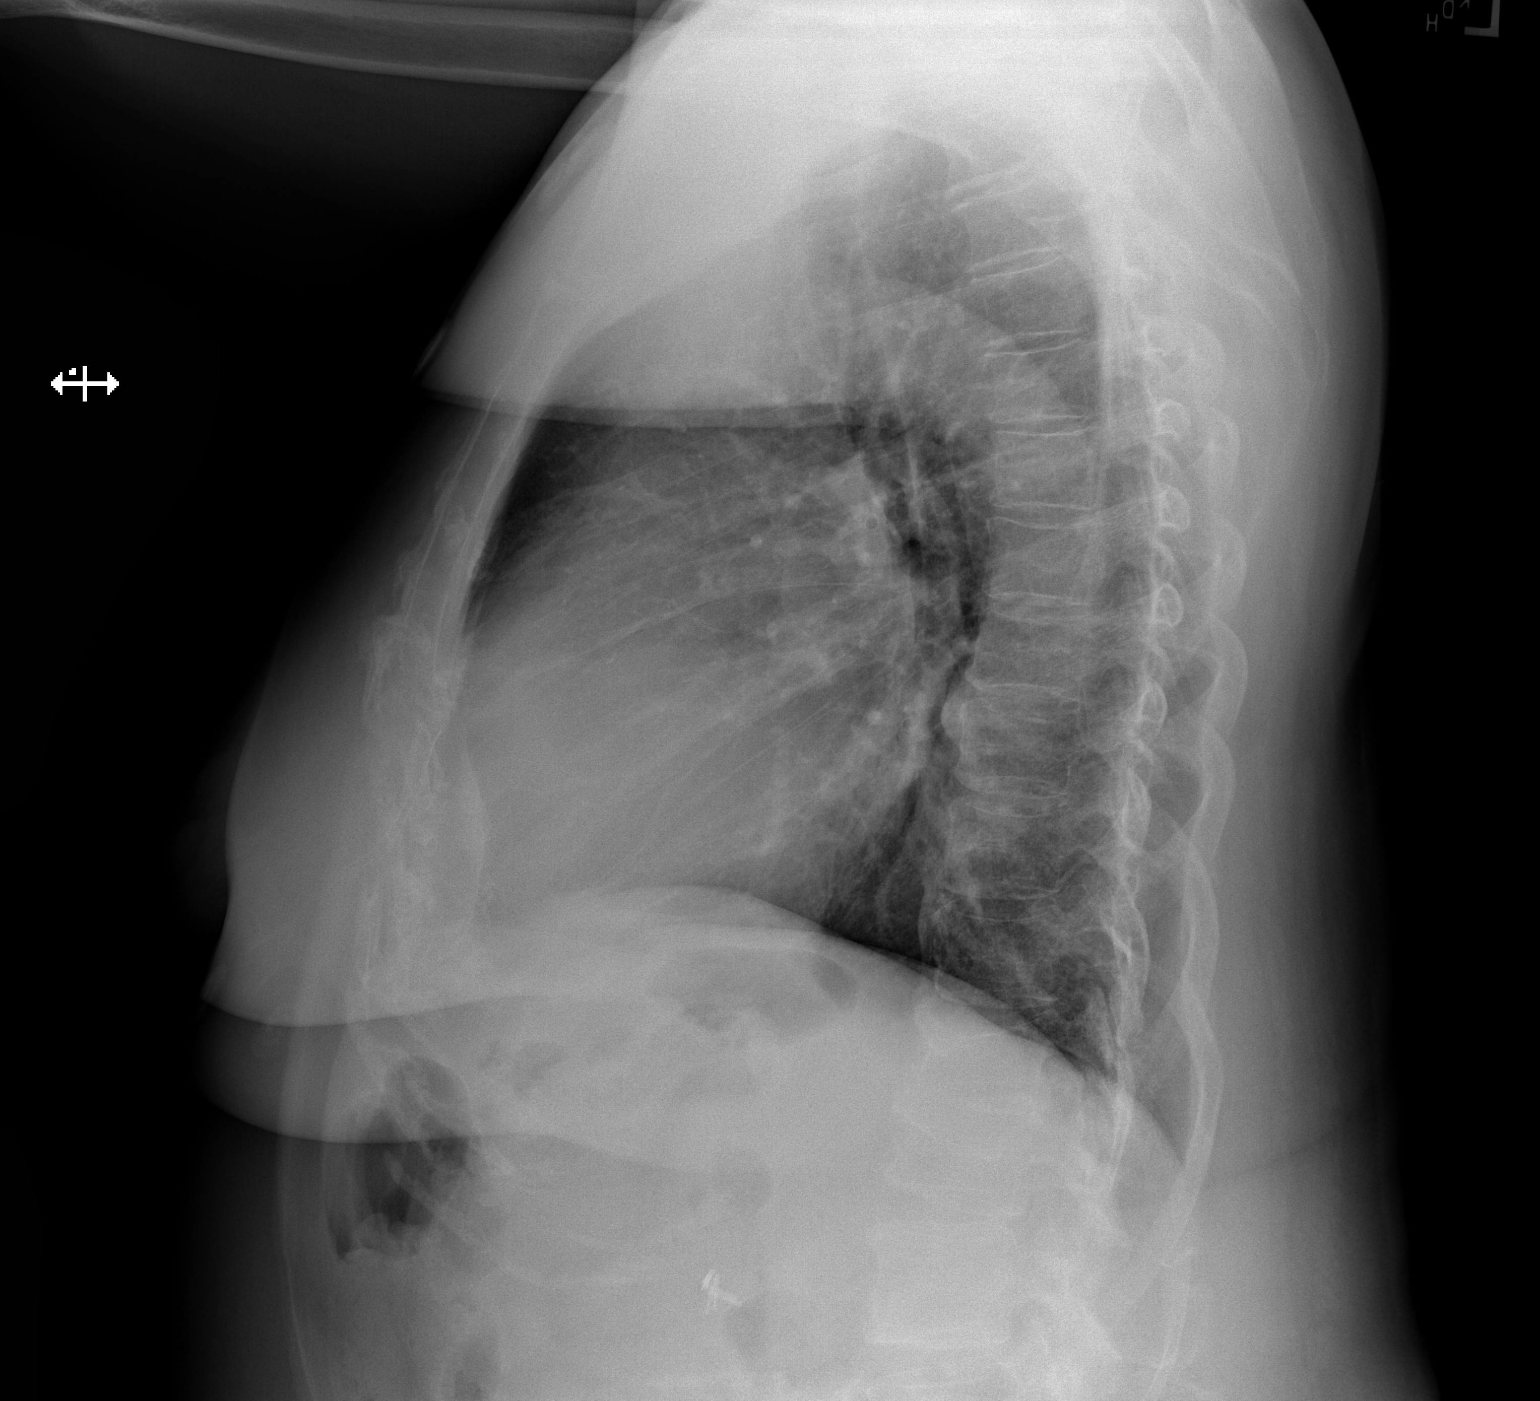

[2 of 2 positions shown; findings below may reference images not displayed]

FINDINGS: Heart is borderline in size. Lungs clear. No effusions or edema. No
acute bony abnormality.
IMPRESSION: No active cardiopulmonary disease.

## 2023-05-19 ENCOUNTER — Other Ambulatory Visit: Payer: Self-pay | Admitting: Family Medicine

## 2023-05-19 DIAGNOSIS — M8580 Other specified disorders of bone density and structure, unspecified site: Secondary | ICD-10-CM | POA: Diagnosis not present

## 2023-05-19 DIAGNOSIS — M545 Low back pain, unspecified: Secondary | ICD-10-CM | POA: Diagnosis not present

## 2023-05-19 DIAGNOSIS — I482 Chronic atrial fibrillation, unspecified: Secondary | ICD-10-CM

## 2023-05-19 DIAGNOSIS — Z1212 Encounter for screening for malignant neoplasm of rectum: Secondary | ICD-10-CM | POA: Diagnosis not present

## 2023-05-19 DIAGNOSIS — Z780 Asymptomatic menopausal state: Secondary | ICD-10-CM | POA: Diagnosis not present

## 2023-05-19 DIAGNOSIS — N3281 Overactive bladder: Secondary | ICD-10-CM | POA: Diagnosis not present

## 2023-05-19 DIAGNOSIS — Z1231 Encounter for screening mammogram for malignant neoplasm of breast: Secondary | ICD-10-CM | POA: Diagnosis not present

## 2023-05-19 DIAGNOSIS — T6391XA Toxic effect of contact with unspecified venomous animal, accidental (unintentional), initial encounter: Secondary | ICD-10-CM | POA: Diagnosis not present

## 2023-05-19 DIAGNOSIS — R8290 Unspecified abnormal findings in urine: Secondary | ICD-10-CM | POA: Diagnosis not present

## 2023-05-19 IMAGING — CR DG CHEST 2V
1 series · 2 of 2 positions shown · non-contrast
Comparison: 09/13/2021

CLINICAL DATA: Awoke with chest tightness, chest pain, and
shortness of breath this morning

EXAM:
CHEST - 2 VIEW

[Series 3: w chest pa · 0.14mm/px · 2 of 2 slices shown]
[im 1/2]
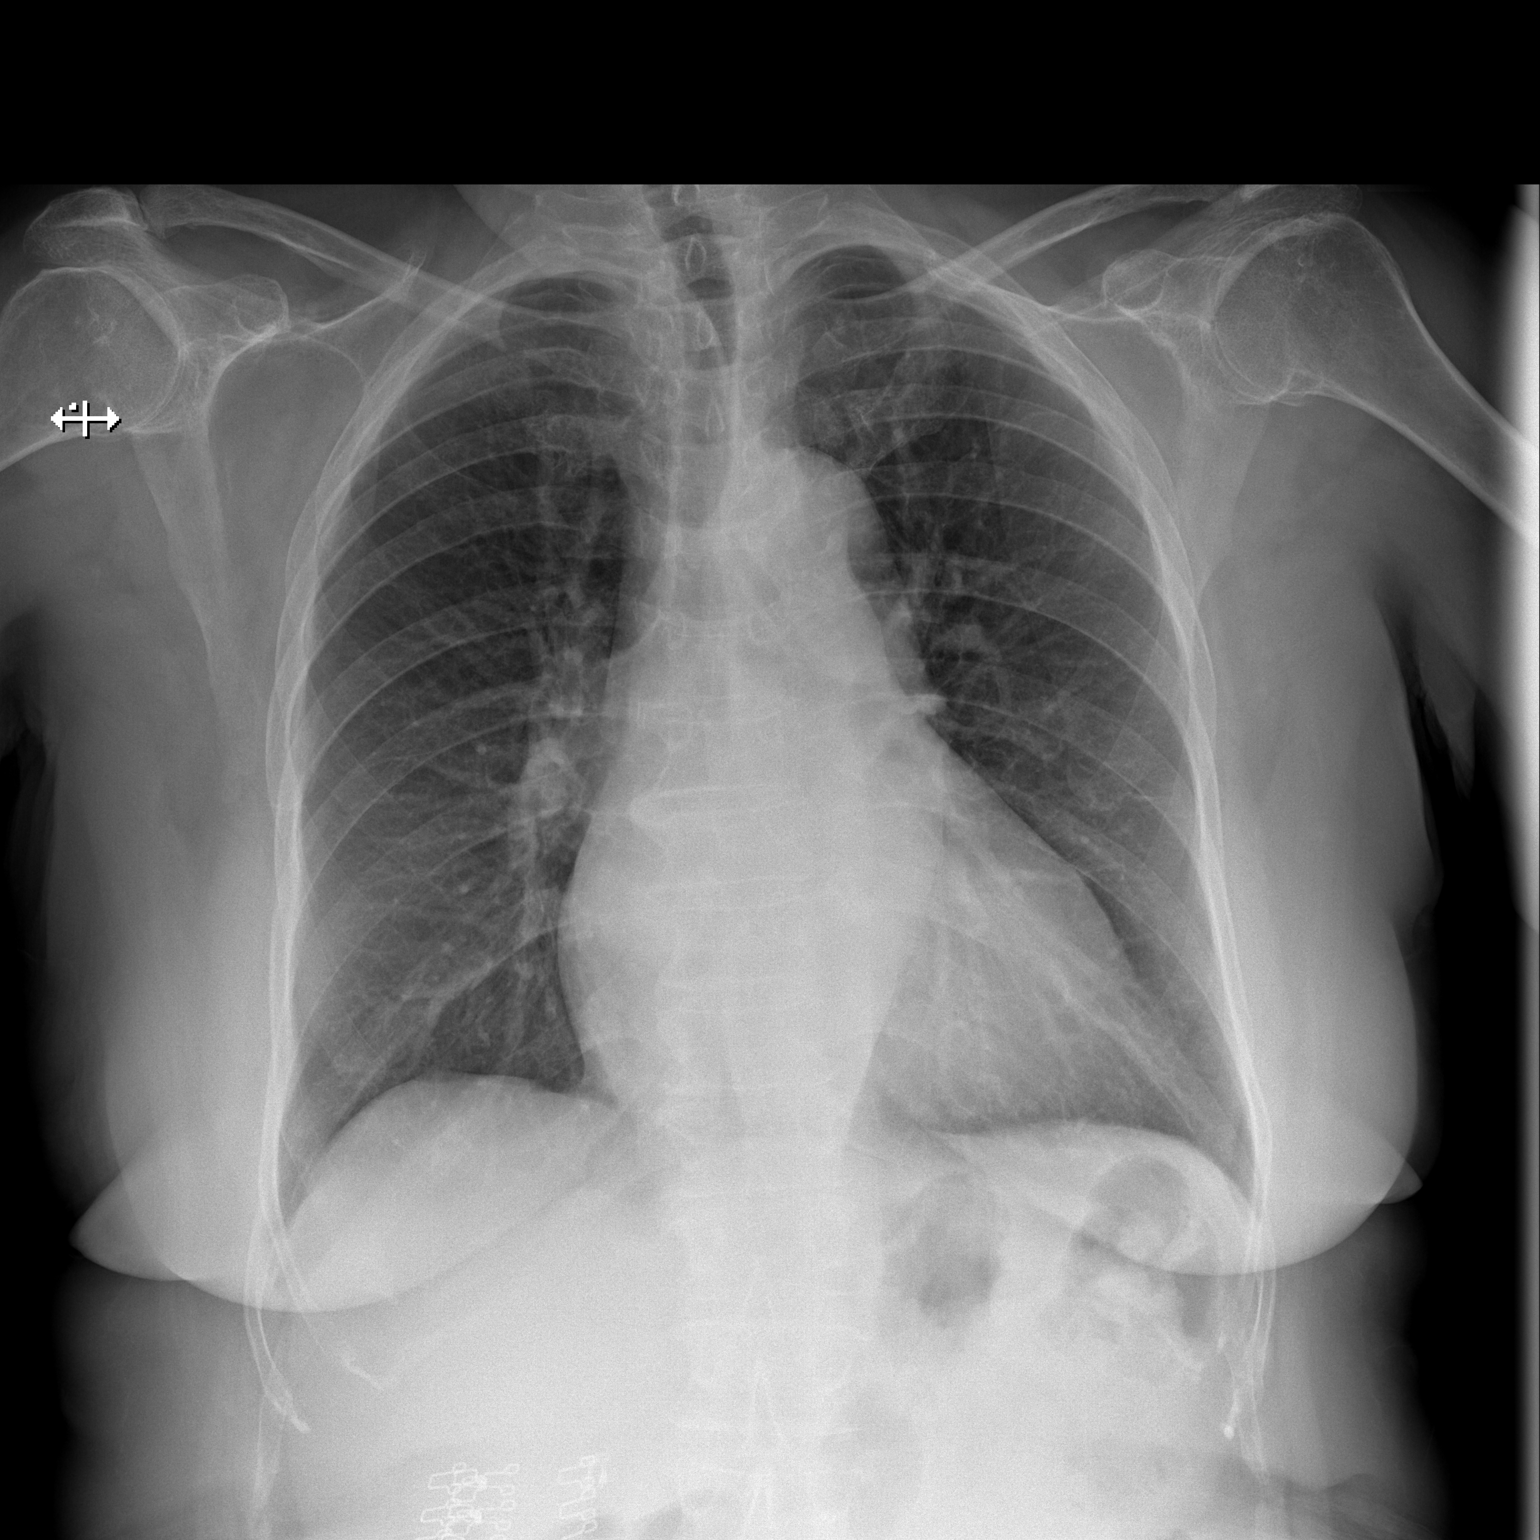
[im 2/2]
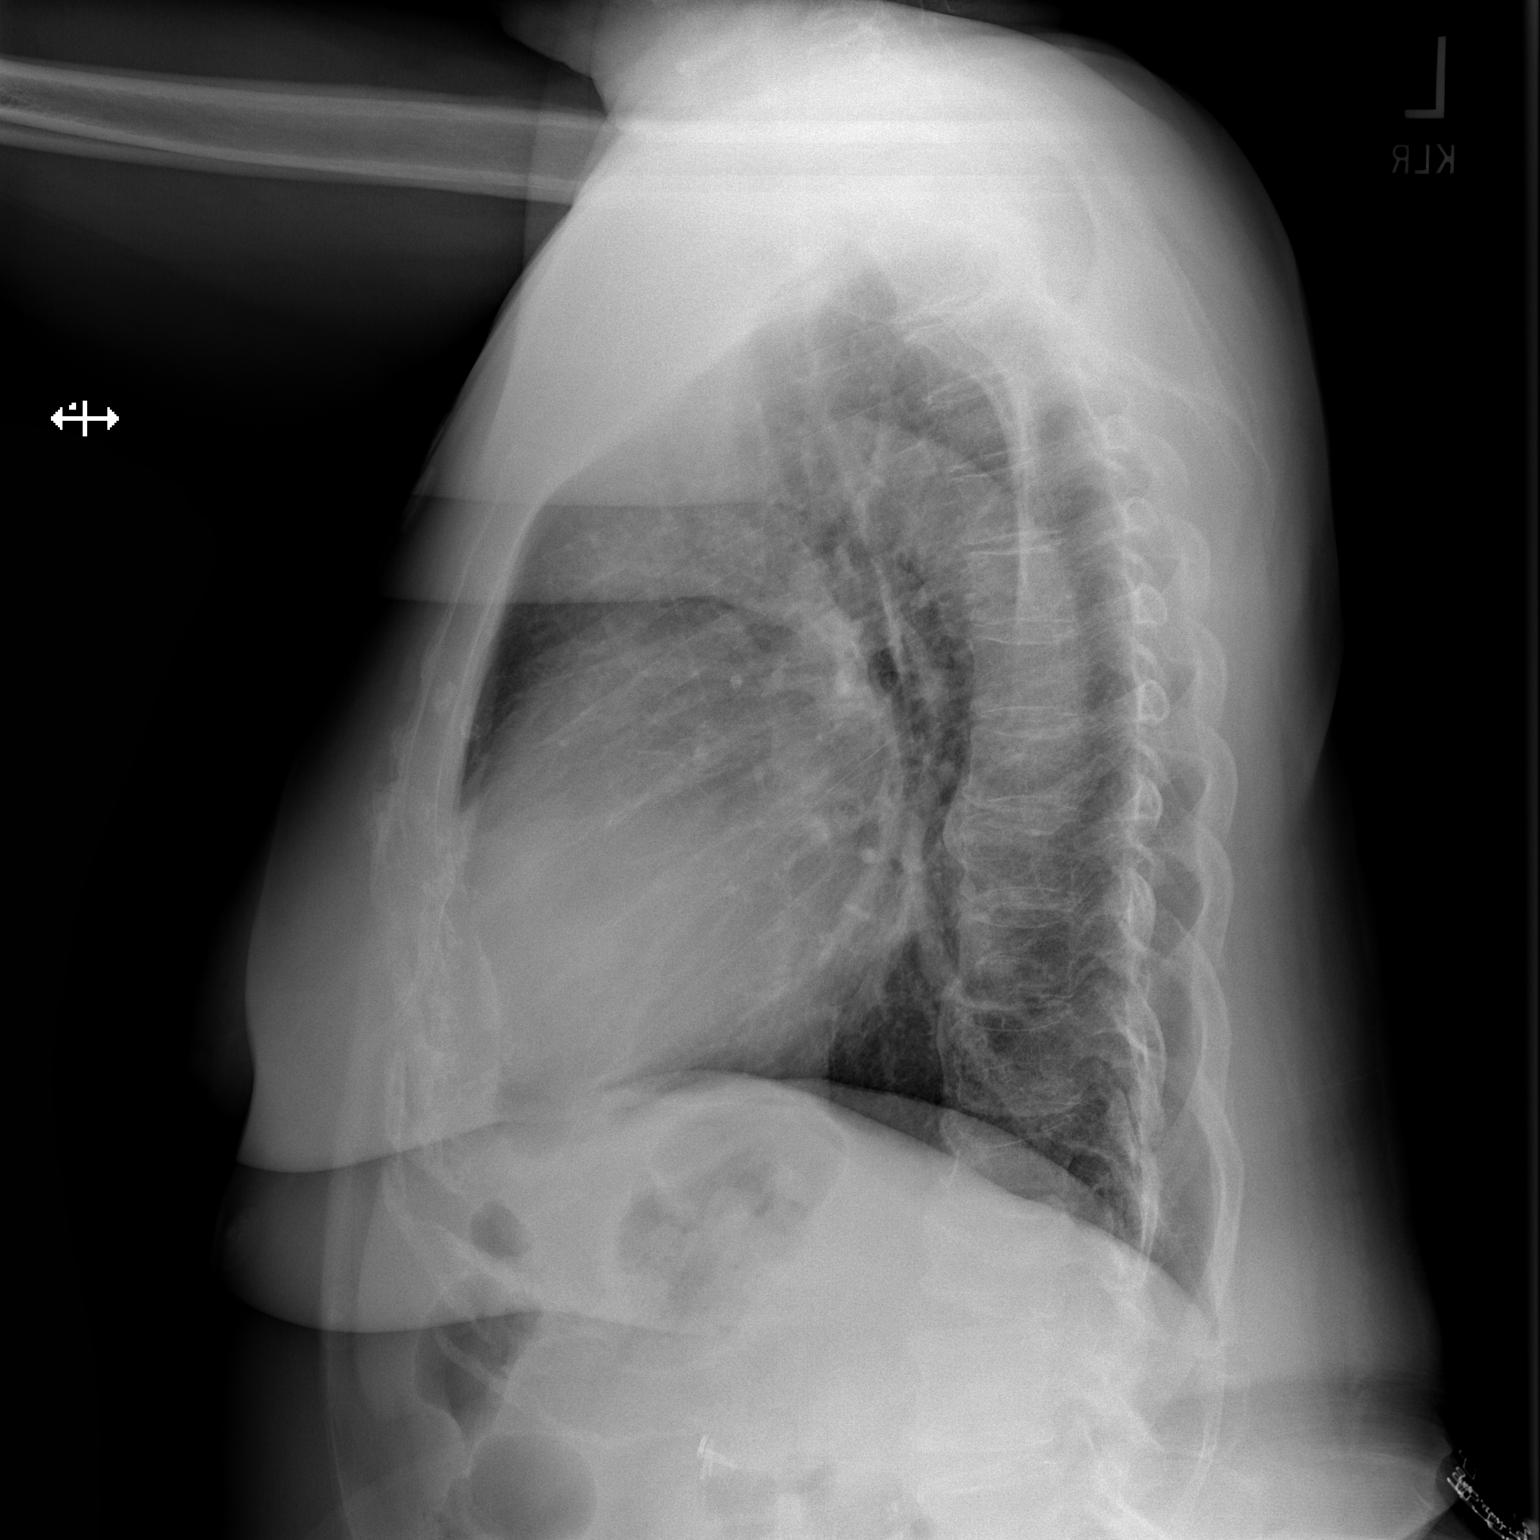

[2 of 2 positions shown; findings below may reference images not displayed]

FINDINGS: Enlargement of cardiac silhouette.

Mediastinal contours and pulmonary vascularity normal.

Lungs clear.

No pulmonary infiltrate, pleural effusion, or pneumothorax.

Bones demineralized.
IMPRESSION: Mild enlargement of cardiac silhouette.

No acute abnormalities.

## 2023-05-24 ENCOUNTER — Ambulatory Visit: Payer: Medicare Other | Admitting: Family Medicine

## 2023-06-15 ENCOUNTER — Ambulatory Visit: Payer: Medicare Other | Admitting: Family Medicine

## 2023-06-21 ENCOUNTER — Other Ambulatory Visit: Payer: Self-pay | Admitting: Family Medicine

## 2023-06-21 DIAGNOSIS — I10 Essential (primary) hypertension: Secondary | ICD-10-CM

## 2023-06-28 DIAGNOSIS — Z1231 Encounter for screening mammogram for malignant neoplasm of breast: Secondary | ICD-10-CM | POA: Diagnosis not present

## 2023-06-28 LAB — HM MAMMOGRAPHY

## 2023-07-11 ENCOUNTER — Ambulatory Visit (INDEPENDENT_AMBULATORY_CARE_PROVIDER_SITE_OTHER): Payer: Medicare Other | Admitting: Family Medicine

## 2023-07-11 ENCOUNTER — Encounter: Payer: Self-pay | Admitting: Family Medicine

## 2023-07-11 ENCOUNTER — Telehealth: Payer: Self-pay | Admitting: Family Medicine

## 2023-07-11 VITALS — BP 124/78 | HR 82 | Ht 67.0 in | Wt 187.1 lb

## 2023-07-11 DIAGNOSIS — E038 Other specified hypothyroidism: Secondary | ICD-10-CM | POA: Diagnosis not present

## 2023-07-11 DIAGNOSIS — I10 Essential (primary) hypertension: Secondary | ICD-10-CM

## 2023-07-11 DIAGNOSIS — E7849 Other hyperlipidemia: Secondary | ICD-10-CM

## 2023-07-11 DIAGNOSIS — Z23 Encounter for immunization: Secondary | ICD-10-CM | POA: Diagnosis not present

## 2023-07-11 DIAGNOSIS — Z13820 Encounter for screening for osteoporosis: Secondary | ICD-10-CM | POA: Diagnosis not present

## 2023-07-11 DIAGNOSIS — I482 Chronic atrial fibrillation, unspecified: Secondary | ICD-10-CM

## 2023-07-11 DIAGNOSIS — R7301 Impaired fasting glucose: Secondary | ICD-10-CM | POA: Diagnosis not present

## 2023-07-11 DIAGNOSIS — E559 Vitamin D deficiency, unspecified: Secondary | ICD-10-CM | POA: Diagnosis not present

## 2023-07-11 DIAGNOSIS — M5137 Other intervertebral disc degeneration, lumbosacral region: Secondary | ICD-10-CM | POA: Diagnosis not present

## 2023-07-11 DIAGNOSIS — M5136 Other intervertebral disc degeneration, lumbar region: Secondary | ICD-10-CM

## 2023-07-11 MED ORDER — AMLODIPINE BESYLATE 10 MG PO TABS
10.0000 mg | ORAL_TABLET | Freq: Every day | ORAL | 3 refills | Status: AC
Start: 2023-07-11 — End: 2023-10-09

## 2023-07-11 MED ORDER — UNABLE TO FIND
0 refills | Status: DC
Start: 2023-07-11 — End: 2023-11-17

## 2023-07-11 MED ORDER — APIXABAN 5 MG PO TABS
5.0000 mg | ORAL_TABLET | Freq: Two times a day (BID) | ORAL | 3 refills | Status: DC
Start: 2023-07-11 — End: 2023-09-19

## 2023-07-11 MED ORDER — LISINOPRIL 20 MG PO TABS
20.0000 mg | ORAL_TABLET | Freq: Every day | ORAL | 3 refills | Status: AC
Start: 2023-07-11 — End: 2023-10-09

## 2023-07-11 NOTE — Telephone Encounter (Signed)
Patient is requesting to speak with somebody in regarding to mammogram results. Please advise Thank you

## 2023-07-11 NOTE — Patient Instructions (Addendum)
I appreciate the opportunity to provide care to you today!    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 Processed Meats:Foods like bacon, sausage, hot dogs, and deli meats are often high in saturated fat and cholesterol. Full-Fat Dairy Products:Whole milk, full-fat yogurt, butter, cream, and cheese are rich in saturated fats, which can increase cholesterol levels. Baked Goods and Sweets:Pastries, cakes, cookies, and donuts often contain trans fats and added sugars, which can raise LDL cholesterol and lower HDL (good) cholesterol. Red Meat:Beef, lamb, and pork are high in saturated fat. Lean cuts or plant-based protein alternatives are better options. Lard and Shortening:Used in some baked goods, lard and shortening are high in trans fats and should be avoided. Fast Food:Many fast food items are cooked with unhealthy oils and contain high amounts of saturated and trans fats. Processed Snacks:Chips, crackers, and certain microwave popcorns can contain trans fats and high levels of unhealthy oils. Shellfish:While nutritious in other ways, some shellfish like shrimp, lobster, and crab are high in cholesterol. They should be consumed in moderation. Coconut and Palm Oils:these oils are high in saturated fat and can raise cholesterol levels when used in cooking or baking.    Referrals today- orthopedic surgery    Attached with your AVS, you will find valuable resources for self-education. I highly recommend dedicating some time to thoroughly examine them.   Please continue to a heart-healthy diet and increase your physical activities. Try to exercise for at least five days a week.    It was a pleasure to see you and I look forward to continuing to work together on your health and well-being. Please do not hesitate to call the office if you need care or have questions about your care.  In case of emergency, please visit the Emergency Department for urgent care, or contact our clinic at [phone] to schedule an appointment. We're here to help you!   Have a wonderful day and week. With Gratitude, Gilmore Laroche MSN, FNP-BC

## 2023-07-11 NOTE — Telephone Encounter (Signed)
[facility]

## 2023-07-11 NOTE — Progress Notes (Signed)
r  Established Patient Office Visit  Subjective:  Patient ID: Kayla Vaughn, female    DOB: [birth year]  Age: 82 y.o. MRN: [id number]  CC:  Chief Complaint  Patient presents with   Care Management    Follow up , needs extended refills at least 6 months or 1 year, 90 day prescriptions.   Back Pain    Pt reports back pain and hip pain, reports pain level is a 6 out 10. Would like to discuss about getting additional testing for second opinion on this possibly an MRI. Also needs a new rx for a back brace.    HPI Kayla Vaughn is a 82 y.o. 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   Degenerative Disc Disease: The patient last followed up with orthopedic surgery in [place] on 03/07/2023. A hip X-ray revealed mild arthritis, and a lumbar spine X-ray showed degenerative disc changes at the endplates with a hint of spondylolisthesis at L5-S1. A referral to physical therapy was placed, but the patient did not attend. She was prescribed gabapentin 100 mg nightly and tizanidine 4 mg nightly but reports not taking either medication. She has only been using Tylenol 500 mg as needed every 6 hours. The patient declines physical therapy at this time and requests a referral to orthopedic surgery for a second opinion, as well as an open MRI of her lower back. Of note, the patient has had two prior back surgeries: the first in 1983 for ruptured discs and the second in 1986 for two more ruptured discs related to her job. She reports doing well after both surgeries until a fall in 2017, which worsened her back pain. The patient is currently on Eliquis for atrial fibrillation and reports treatment compliance. She denies bowel or bladder incontinence and has no reported weakness in her lower extremities.  Hypertension: The patient takes lisinopril 20 mg daily  and amlodipine 10 mg daily. She is asymptomatic and reports compliance with her treatment regimen.  Hyperlipidemia: The patient is not currently on statin therapy for cholesterol. Her last LDL cholesterol reading on 06/04/2022 was 123.    Past Medical History:  Diagnosis Date   Atrial fibrillation (HCC)    Brain aneurysm 1996   Hyperlipemia    Hypertension    Motion sickness    boats, long car rides   Wears dentures    full upper    Past Surgical History:  Procedure Laterality Date   ABDOMINAL HYSTERECTOMY     BACK SURGERY     x2   BRAIN SURGERY  1996   CATARACT EXTRACTION W/PHACO Right 02/06/2021   Procedure: CATARACT EXTRACTION PHACO AND INTRAOCULAR LENS PLACEMENT (IOC) RIGHT 5.43 01:08.1 8.0%;  Surgeon: Lockie Mola, MD;  Location: [facility];  Service: Ophthalmology;  Laterality: Right;   CATARACT EXTRACTION W/PHACO Left 02/18/2021   Procedure: CATARACT EXTRACTION PHACO AND INTRAOCULAR LENS PLACEMENT (IOC) LEFT 8.25 01:13.2 11.2%;  Surgeon: Lockie Mola, MD;  Location: [facility];  Service: Ophthalmology;  Laterality: Left;   CEREBRAL ANEURYSM REPAIR     CHOLECYSTECTOMY     COLONOSCOPY WITH PROPOFOL N/A 01/10/2023   Procedure: COLONOSCOPY WITH PROPOFOL;  Surgeon: Lanelle Bal, DO;  Location: [facility];  Service: Endoscopy;  Laterality: N/A;  10:00 AM, asa 3    Family History  Problem Relation Age of Onset   Breast cancer  Maternal Grandmother     Social History   Socioeconomic History   Marital status: Legally Separated    Spouse name: Not on file   Number of children: Not on file   Years of education: Not on file   Highest education level: Not on file  Occupational History   Occupation: retired  Tobacco Use   Smoking status: Former    Current packs/day: 0.00    Average packs/day: 0.3 packs/day for 20.0 years (5.0 ttl pk-yrs)    Types: Cigarettes    Start date: 88    Quit date: 1994    Years since quitting: 30.7     Passive exposure: Past   Smokeless tobacco: Never  Vaping Use   Vaping status: Never Used  Substance and Sexual Activity   Alcohol use: No   Drug use: No   Sexual activity: Not on file  Other Topics Concern   Not on file  Social History Narrative   Not on file   Social Determinants of Health   Financial Resource Strain: Not on file  Food Insecurity: Not on file  Transportation Needs: Not on file  Physical Activity: Not on file  Stress: Not on file  Social Connections: Not on file  Intimate Partner Violence: Not on file    Outpatient Medications Prior to Visit  Medication Sig Dispense Refill   ALPRAZolam (XANAX) 0.25 MG tablet Take 0.25 mg by mouth daily as needed for anxiety (traveling).     Cholecalciferol (VITAMIN D) 125 MCG (5000 UT) CAPS Take 5,000 Units by mouth daily.     COMBIGAN 0.2-0.5 % ophthalmic solution Place 1 drop into both eyes every morning.     ketoconazole (NIZORAL) 2 % cream Apply 1 Application topically daily. 15 g 0   latanoprost (XALATAN) 0.005 % ophthalmic solution Place 1 drop into both eyes at bedtime.     Multiple Vitamins-Minerals (WOMENS 50+ MULTI VITAMIN/MIN PO) Take 1 tablet by mouth daily.     Omega-3 Fatty Acids (FISH OIL) 1000 MG CAPS Take 1,000 mg by mouth daily.     amLODipine (NORVASC) 10 MG tablet TAKE 1 TABLET BY MOUTH EVERY DAY 90 tablet 1   DULoxetine (CYMBALTA) 30 MG capsule TAKE 1 CAPSULE BY MOUTH EVERY DAY 90 capsule 2   ELIQUIS 5 MG TABS tablet TAKE 1 TABLET BY MOUTH TWICE A DAY 60 tablet 1   gabapentin (NEURONTIN) 100 MG capsule Take 1 capsule (100 mg total) by mouth at bedtime. 42 capsule 0   lisinopril (ZESTRIL) 20 MG tablet TAKE 1 TABLET BY MOUTH EVERY DAY 90 tablet 1   tiZANidine (ZANAFLEX) 4 MG tablet TAKE 1 TABLET BY MOUTH EVERY DAY 90 tablet 1   No facility-administered medications prior to visit.    Allergies  Allergen Reactions   Sulfa Antibiotics Other (See Comments)    Pt does not remember reaction      ROS Review of Systems  Constitutional:  Negative for chills and fever.  Eyes:  Negative for visual disturbance.  Respiratory:  Negative for chest tightness and shortness of breath.   Musculoskeletal:  Positive for back pain.  Neurological:  Negative for dizziness and headaches.      Objective:    Physical Exam HENT:     Head: Normocephalic.     Mouth/Throat:     Mouth: Mucous membranes are moist.  Cardiovascular:     Rate and Rhythm: Normal rate.     Heart sounds: Normal heart sounds.  Pulmonary:  Effort: Pulmonary effort is normal.     Breath sounds: Normal breath sounds.  Neurological:     Mental Status: She is alert.     BP 124/78   Pulse 82   Ht 5\' 7"  (1.702 m)   Wt 187 lb 1.3 oz (84.9 kg)   SpO2 95%   BMI 29.30 kg/m  Wt Readings from Last 3 Encounters:  07/11/23 187 lb 1.3 oz (84.9 kg)  03/07/23 185 lb (83.9 kg)  02/22/23 190 lb (86.2 kg)    Lab Results  Component Value Date   TSH 3.150 08/04/2022   Lab Results  Component Value Date   WBC 2.8 (L) 06/04/2022   HGB 13.1 06/04/2022   HCT 41.5 06/04/2022   MCV 87 06/04/2022   PLT 237 06/04/2022   Lab Results  Component Value Date   NA 142 06/04/2022   K 4.0 06/04/2022   CO2 22 06/04/2022   GLUCOSE 95 06/04/2022   BUN 15 06/04/2022   CREATININE 1.28 (H) 06/04/2022   BILITOT 0.4 06/04/2022   ALKPHOS 69 06/04/2022   AST 16 06/04/2022   ALT 15 06/04/2022   PROT 6.4 06/04/2022   ALBUMIN 4.1 06/04/2022   CALCIUM 9.8 06/04/2022   ANIONGAP 8 10/05/2021   EGFR 43 (L) 06/04/2022   Lab Results  Component Value Date   CHOL 186 06/04/2022   Lab Results  Component Value Date   HDL 47 06/04/2022   Lab Results  Component Value Date   LDLCALC 123 (H) 06/04/2022   Lab Results  Component Value Date   TRIG 90 06/04/2022   Lab Results  Component Value Date   CHOLHDL 4.0 06/04/2022   Lab Results  Component Value Date   HGBA1C 6.1 (H) 06/04/2022      Assessment & Plan:  Degenerative  disc disease, lumbar Assessment & Plan: Take 500 mg of Tylenol every 6 hours for pain Take the muscle relaxer in the evening Take the gabapentin in the evening I recommend heat/cold application to the lower back and strengthening and stretching exercises   Other hyperlipidemia Assessment & Plan: LDL is not at goal of less than 70 Pending lipid panel Here are some foods to avoid or reduce in your diet to help manage cholesterol levels:  Fried Foods:Deep-fried items such as french fries, fried chicken, and fried snacks are high in unhealthy fats and can raise LDL (bad) cholesterol levels. Processed Meats:Foods like bacon, sausage, hot dogs, and deli meats are often high in saturated fat and cholesterol. Full-Fat Dairy Products:Whole milk, full-fat yogurt, butter, cream, and cheese are rich in saturated fats, which can increase cholesterol levels. Baked Goods and Sweets:Pastries, cakes, cookies, and donuts often contain trans fats and added sugars, which can raise LDL cholesterol and lower HDL (good) cholesterol. Red Meat:Beef, lamb, and pork are high in saturated fat. Lean cuts or plant-based protein alternatives are better options. Lard and Shortening:Used in some baked goods, lard and shortening are high in trans fats and should be avoided. Fast Food:Many fast food items are cooked with unhealthy oils and contain high amounts of saturated and trans fats. Processed Snacks:Chips, crackers, and certain microwave popcorns can contain trans fats and high levels of unhealthy oils. Shellfish:While nutritious in other ways, some shellfish like shrimp, lobster, and crab are high in cholesterol. They should be consumed in moderation. Coconut and Palm Oils:these oils are high in saturated fat and can raise cholesterol levels when used in cooking or baking.      Hypertension, unspecified  type Assessment & Plan: Controlled Encouraged to continue taking lisinopril 20 mg daily and amlodipine 10 mg  daily Low-sodium diet with increased physical activity encouraged BP Readings from Last 3 Encounters:  07/11/23 124/78  03/07/23 125/69  02/22/23 130/85     Orders: -     amLODIPine Besylate; Take 1 tablet (10 mg total) by mouth daily.  Dispense: 90 tablet; Refill: 3 -     Lisinopril; Take 1 tablet (20 mg total) by mouth daily.  Dispense: 90 tablet; Refill: 3  Atrial fibrillation, chronic (HCC) -     Apixaban; Take 1 tablet (5 mg total) by mouth 2 (two) times daily.  Dispense: 60 tablet; Refill: 3  DDD (degenerative disc disease), lumbosacral -     UNABLE TO FIND; Med Name: back brace ICD:M51.37  Dispense: 1 each; Refill: 0 -     Ambulatory referral to Orthopedic Surgery  Osteoporosis screening -     DG Bone Density  Encounter for immunization -     Flu Vaccine Trivalent High Dose (Fluad)  Note: This chart has been completed using Engineer, civil (consulting) software, and while attempts have been made to ensure accuracy, certain words and phrases may not be transcribed as intended.    Follow-up: Return in about 4 months (around 11/10/2023).   Gilmore Laroche, FNP

## 2023-07-11 NOTE — Assessment & Plan Note (Signed)
Take 500 mg of Tylenol every 6 hours for pain Take the muscle relaxer in the evening Take the gabapentin in the evening I recommend heat/cold application to the lower back and strengthening and stretching exercises

## 2023-07-11 NOTE — Assessment & Plan Note (Signed)
Controlled Encouraged to continue taking lisinopril 20 mg daily and amlodipine 10 mg daily Low-sodium diet with increased physical activity encouraged BP Readings from Last 3 Encounters:  07/11/23 124/78  03/07/23 125/69  02/22/23 130/85

## 2023-07-11 NOTE — Assessment & Plan Note (Signed)
LDL is not at goal of less than 70 Pending lipid panel Here are some foods to avoid or reduce in your diet to help manage cholesterol levels:  Fried Foods:Deep-fried items such as french fries, fried chicken, and fried snacks are high in unhealthy fats and can raise LDL (bad) cholesterol levels. Processed Meats:Foods like bacon, sausage, hot dogs, and deli meats are often high in saturated fat and cholesterol. Full-Fat Dairy Products:Whole milk, full-fat yogurt, butter, cream, and cheese are rich in saturated fats, which can increase cholesterol levels. Baked Goods and Sweets:Pastries, cakes, cookies, and donuts often contain trans fats and added sugars, which can raise LDL cholesterol and lower HDL (good) cholesterol. Red Meat:Beef, lamb, and pork are high in saturated fat. Lean cuts or plant-based protein alternatives are better options. Lard and Shortening:Used in some baked goods, lard and shortening are high in trans fats and should be avoided. Fast Food:Many fast food items are cooked with unhealthy oils and contain high amounts of saturated and trans fats. Processed Snacks:Chips, crackers, and certain microwave popcorns can contain trans fats and high levels of unhealthy oils. Shellfish:While nutritious in other ways, some shellfish like shrimp, lobster, and crab are high in cholesterol. They should be consumed in moderation. Coconut and Palm Oils:these oils are high in saturated fat and can raise cholesterol levels when used in cooking or baking.

## 2023-07-12 ENCOUNTER — Telehealth: Payer: Self-pay | Admitting: Family Medicine

## 2023-07-12 ENCOUNTER — Other Ambulatory Visit: Payer: Self-pay | Admitting: Family Medicine

## 2023-07-12 DIAGNOSIS — D72819 Decreased white blood cell count, unspecified: Secondary | ICD-10-CM

## 2023-07-12 NOTE — Telephone Encounter (Signed)
Patient returning call for lab/imaging results

## 2023-07-13 ENCOUNTER — Other Ambulatory Visit: Payer: Self-pay | Admitting: Family Medicine

## 2023-07-13 DIAGNOSIS — E038 Other specified hypothyroidism: Secondary | ICD-10-CM

## 2023-07-13 DIAGNOSIS — E7849 Other hyperlipidemia: Secondary | ICD-10-CM

## 2023-07-13 MED ORDER — ROSUVASTATIN CALCIUM 10 MG PO TABS
10.0000 mg | ORAL_TABLET | Freq: Every day | ORAL | 3 refills | Status: DC
Start: 2023-07-13 — End: 2023-11-17

## 2023-07-13 NOTE — Progress Notes (Signed)
  Please inform the patient that a referral has been placed to hematology for her low white blood count. I am also initiating rosuvastatin 10 mg daily to help achieve an LDL goal of 70 or less. Additionally, I recommend decreasing her intake of greasy, starchy, and fatty foods while increasing physical activity. Her thyroid levels indicate subclinical hypothyroidism, and I recommend repeating her thyroid levels in 6 weeks. Orders have been placed accordingly.

## 2023-07-13 NOTE — Telephone Encounter (Signed)
Pt asking if mammogram results were faxed from [facility], I do not see any results in chart , called [facility] spoke to [facility] [phone], stated she will fax over the results.

## 2023-08-08 ENCOUNTER — Telehealth: Payer: Self-pay | Admitting: Family Medicine

## 2023-08-08 NOTE — Telephone Encounter (Signed)
Patient called needs for a nurse to give her a call at [phone] to explain her blood work results over the phone she does not understand the paper work received.

## 2023-08-09 ENCOUNTER — Other Ambulatory Visit: Payer: Self-pay | Admitting: Family Medicine

## 2023-08-09 DIAGNOSIS — D72819 Decreased white blood cell count, unspecified: Secondary | ICD-10-CM

## 2023-08-09 NOTE — Telephone Encounter (Signed)
Pt said nobody has called her from hematology but referral notes say they tried x3 so she wants to be referred to [facility]

## 2023-08-19 DIAGNOSIS — H401123 Primary open-angle glaucoma, left eye, severe stage: Secondary | ICD-10-CM | POA: Diagnosis not present

## 2023-08-19 DIAGNOSIS — Z961 Presence of intraocular lens: Secondary | ICD-10-CM | POA: Diagnosis not present

## 2023-08-19 DIAGNOSIS — H401111 Primary open-angle glaucoma, right eye, mild stage: Secondary | ICD-10-CM | POA: Diagnosis not present

## 2023-08-22 ENCOUNTER — Telehealth: Payer: Self-pay | Admitting: Family Medicine

## 2023-09-07 ENCOUNTER — Inpatient Hospital Stay: Payer: Medicare Other

## 2023-09-07 ENCOUNTER — Inpatient Hospital Stay: Payer: Medicare Other | Attending: Oncology | Admitting: Oncology

## 2023-09-07 ENCOUNTER — Encounter: Payer: Self-pay | Admitting: Oncology

## 2023-09-07 VITALS — BP 129/89 | HR 51 | Temp 97.9°F | Resp 16 | Ht 67.0 in | Wt 183.0 lb

## 2023-09-07 DIAGNOSIS — D72819 Decreased white blood cell count, unspecified: Secondary | ICD-10-CM | POA: Diagnosis not present

## 2023-09-07 DIAGNOSIS — D709 Neutropenia, unspecified: Secondary | ICD-10-CM

## 2023-09-07 DIAGNOSIS — M25551 Pain in right hip: Secondary | ICD-10-CM

## 2023-09-07 DIAGNOSIS — G8929 Other chronic pain: Secondary | ICD-10-CM | POA: Insufficient documentation

## 2023-09-07 LAB — URINALYSIS, COMPLETE (UACMP) WITH MICROSCOPIC
Bilirubin Urine: NEGATIVE
Glucose, UA: NEGATIVE mg/dL
Hgb urine dipstick: NEGATIVE
Ketones, ur: NEGATIVE mg/dL
Nitrite: NEGATIVE
Protein, ur: 30 mg/dL — AB
Specific Gravity, Urine: 1.018 (ref 1.005–1.030)
pH: 6 (ref 5.0–8.0)

## 2023-09-07 LAB — CBC WITH DIFFERENTIAL/PLATELET
Abs Immature Granulocytes: 0 10*3/uL (ref 0.00–0.07)
Basophils Absolute: 0 10*3/uL (ref 0.0–0.1)
Basophils Relative: 1 %
Eosinophils Absolute: 0.1 10*3/uL (ref 0.0–0.5)
Eosinophils Relative: 3 %
HCT: 45.9 % (ref 36.0–46.0)
Hemoglobin: 14.4 g/dL (ref 12.0–15.0)
Immature Granulocytes: 0 %
Lymphocytes Relative: 38 %
Lymphs Abs: 0.9 10*3/uL (ref 0.7–4.0)
MCH: 27.6 pg (ref 26.0–34.0)
MCHC: 31.4 g/dL (ref 30.0–36.0)
MCV: 87.9 fL (ref 80.0–100.0)
Monocytes Absolute: 0.2 10*3/uL (ref 0.1–1.0)
Monocytes Relative: 9 %
Neutro Abs: 1.2 10*3/uL — ABNORMAL LOW (ref 1.7–7.7)
Neutrophils Relative %: 49 %
Platelets: 227 10*3/uL (ref 150–400)
RBC: 5.22 MIL/uL — ABNORMAL HIGH (ref 3.87–5.11)
RDW: 14.7 % (ref 11.5–15.5)
WBC: 2.4 10*3/uL — ABNORMAL LOW (ref 4.0–10.5)
nRBC: 0 % (ref 0.0–0.2)

## 2023-09-07 LAB — LACTATE DEHYDROGENASE: LDH: 153 U/L (ref 98–192)

## 2023-09-07 LAB — FOLATE: Folate: 24.4 ng/mL (ref >5.9)

## 2023-09-07 LAB — RETICULOCYTES
Immature Retic Fract: 9.7 % (ref 2.3–15.9)
RBC.: 5.07 MIL/uL (ref 3.87–5.11)
Retic Count, Absolute: 53.2 10*3/uL (ref 19.0–186.0)
Retic Ct Pct: 1.1 % (ref 0.4–3.1)

## 2023-09-07 LAB — COMPREHENSIVE METABOLIC PANEL
ALT: 18 U/L (ref 0–44)
AST: 19 U/L (ref 15–41)
Albumin: 3.9 g/dL (ref 3.5–5.0)
Alkaline Phosphatase: 53 U/L (ref 38–126)
Anion gap: 8 (ref 5–15)
BUN: 18 mg/dL (ref 8–23)
CO2: 26 mmol/L (ref 22–32)
Calcium: 9.5 mg/dL (ref 8.9–10.3)
Chloride: 107 mmol/L (ref 98–111)
Creatinine, Ser: 1.12 mg/dL — ABNORMAL HIGH (ref 0.44–1.00)
GFR, Estimated: 50 mL/min — ABNORMAL LOW (ref >60)
Glucose, Bld: 100 mg/dL — ABNORMAL HIGH (ref 70–99)
Potassium: 4.1 mmol/L (ref 3.5–5.1)
Sodium: 141 mmol/L (ref 135–145)
Total Bilirubin: 0.7 mg/dL (ref <1.2)
Total Protein: 6.9 g/dL (ref 6.5–8.1)

## 2023-09-07 LAB — VITAMIN B12: Vitamin B-12: 477 pg/mL (ref 180–914)

## 2023-09-07 LAB — FERRITIN: Ferritin: 206 ng/mL (ref 11–307)

## 2023-09-07 NOTE — Assessment & Plan Note (Addendum)
Chronic low white blood cell count since 2019, recently noted to be lower. Discussed potential causes including infection, medication, and leukemia, but also mentioned the possibility of a benign genetic condition common in African Americans called Duffy Null. No current signs of infection. -Order comprehensive blood work to assess for nutritional deficiencies and flow cytometry -Plan to monitor white blood cell count and consider bone marrow biopsy if levels continue to drop or if patient develops frequent infections.  Follow-up in 1-2 months to review results of blood work and discuss any changes in symptoms.

## 2023-09-07 NOTE — Patient Instructions (Signed)
VISIT SUMMARY:  Ms. Biernat, during your visit today, we discussed your low white blood cell count, chronic right hip pain, and ongoing management of atrial fibrillation. We reviewed your symptoms and medical history to develop a plan for further evaluation and treatment.  YOUR PLAN:  -LEUKOPENIA: Leukopenia means having a low white blood cell count, which can make it harder for your body to fight infections. We discussed possible causes, including infections, medications, and genetic conditions. We will do comprehensive blood work to check for nutritional deficiencies and rule out leukemia. If your white blood cell count continues to drop or you get frequent infections, we may consider a bone marrow biopsy.  -RIGHT HIP PAIN: Your chronic right hip pain has been ongoing since your fall in 2017 and affects your daily activities. We will do a urine test to rule out a urinary tract infection as a potential cause of the pain.   -ATRIAL FIBRILLATION: Atrial fibrillation is an irregular and often rapid heart rate that can increase your risk of strokes. You are currently taking Eliquis to prevent strokes, and you should continue taking it as prescribed.  INSTRUCTIONS:  Please follow up in 1-2 months to review the results of your blood work and discuss any changes in your symptoms.

## 2023-09-07 NOTE — Progress Notes (Signed)
[facility] HEMATOLOGY NEW VISIT  Gilmore Laroche, FNP  REASON FOR REFERRAL: Leukopenia  SUMMARY OF HEMATOLOGIC HISTORY:    Latest Ref Rng & Units 09/07/2023   11:53 AM 09/07/2023   11:52 AM 07/11/2023    9:25 AM  CBC EXTENDED  WBC 4.0 - 10.5 K/uL  2.4  2.5   RBC 3.87 - 5.11 MIL/uL 5.07  5.22  4.79   Hemoglobin 12.0 - 15.0 g/dL  40.9  81.1   HCT 91.4 - 46.0 %  45.9  43.2   Platelets 150 - 400 K/uL  227  210   NEUT# 1.7 - 7.7 K/uL  1.2  1.2   Lymph# 0.7 - 4.0 K/uL  0.9  1.0        HISTORY OF PRESENT ILLNESS: Kayla Vaughn 82 y.o. female referred for leukopenia.  She has a past medical history ofHypertension, A-fib on Eliquis, hyperlipidemia.She reports that she had severe pain during pap smear and also reports B/L hip pain. She had no other complaints. She denies fever, chills, weight loss, abdominal pain, fatigue. Overall doing well.  I have reviewed the past medical history, past surgical history, social history and family history with the patient   ALLERGIES:  is allergic to sulfa antibiotics.  MEDICATIONS:  Current Outpatient Medications  Medication Sig Dispense Refill   ALPRAZolam (XANAX) 0.25 MG tablet Take 0.25 mg by mouth daily as needed for anxiety (traveling).     amLODipine (NORVASC) 10 MG tablet Take 1 tablet (10 mg total) by mouth daily. 90 tablet 3   apixaban (ELIQUIS) 5 MG TABS tablet Take 1 tablet (5 mg total) by mouth 2 (two) times daily. 60 tablet 3   Cholecalciferol (VITAMIN D) 125 MCG (5000 UT) CAPS Take 5,000 Units by mouth daily.     COMBIGAN 0.2-0.5 % ophthalmic solution Place 1 drop into both eyes every morning.     ketoconazole (NIZORAL) 2 % cream Apply 1 Application topically daily. 15 g 0   latanoprost (XALATAN) 0.005 % ophthalmic solution Place 1 drop into both eyes at bedtime.     lisinopril (ZESTRIL) 20 MG tablet Take 1 tablet (20 mg total) by mouth daily. 90 tablet 3   Multiple Vitamins-Minerals (WOMENS  50+ MULTI VITAMIN/MIN PO) Take 1 tablet by mouth daily.     Omega-3 Fatty Acids (FISH OIL) 1000 MG CAPS Take 1,000 mg by mouth daily.     rosuvastatin (CRESTOR) 10 MG tablet Take 1 tablet (10 mg total) by mouth daily. 90 tablet 3   UNABLE TO FIND Med Name: back brace ICD:M51.37 1 each 0   No current facility-administered medications for this visit.     REVIEW OF SYSTEMS:   Constitutional: Denies fevers, chills or night sweats Eyes: Denies blurriness of vision Ears, nose, mouth, throat, and face: Denies mucositis or sore throat Respiratory: Denies cough, dyspnea or wheezes Cardiovascular: Denies palpitation, chest discomfort or lower extremity swelling Gastrointestinal:  Denies nausea, heartburn or change in bowel habits Skin: Denies abnormal skin rashes Lymphatics: Denies new lymphadenopathy or easy bruising Neurological:Denies numbness, tingling or new weaknesses Behavioral/Psych: Mood is stable, no new changes  All other systems were reviewed with the patient and are negative.  PHYSICAL EXAMINATION:   Vitals:   09/07/23 1124  BP: 129/89  Pulse: (!) 51  Resp: 16  Temp: 97.9 F (36.6 C)  SpO2: 100%    GENERAL:alert, no distress and comfortable LYMPH:  no palpable lymphadenopathy in the cervical, axillary or inguinal  LUNGS: clear to auscultation and percussion with normal breathing effort HEART: regular rate & rhythm and no murmurs and no lower extremity edema ABDOMEN:abdomen soft, non-tender and normal bowel sounds Musculoskeletal:no cyanosis of digits and no clubbing  NEURO: alert & oriented x 3 with fluent speech  LABORATORY DATA:  I have reviewed the data as listed  Lab Results  Component Value Date   WBC 2.4 (L) 09/07/2023   NEUTROABS 1.2 (L) 09/07/2023   HGB 14.4 09/07/2023   HCT 45.9 09/07/2023   MCV 87.9 09/07/2023   PLT 227 09/07/2023      Component Value Date/Time   NA 141 09/07/2023 1152   NA 145 (H) 07/11/2023 0925   K 4.1 09/07/2023 1152   CL  107 09/07/2023 1152   CO2 26 09/07/2023 1152   GLUCOSE 100 (H) 09/07/2023 1152   BUN 18 09/07/2023 1152   BUN 10 07/11/2023 0925   CREATININE 1.12 (H) 09/07/2023 1152   CALCIUM 9.5 09/07/2023 1152   PROT 6.9 09/07/2023 1152   PROT 6.6 07/11/2023 0925   ALBUMIN 3.9 09/07/2023 1152   ALBUMIN 4.3 07/11/2023 0925   AST 19 09/07/2023 1152   ALT 18 09/07/2023 1152   ALKPHOS 53 09/07/2023 1152   BILITOT 0.7 09/07/2023 1152   BILITOT 0.5 07/11/2023 0925   GFRNONAA 50 (L) 09/07/2023 1152   GFRAA >60 11/25/2017 1812      Chemistry      Component Value Date/Time   NA 141 09/07/2023 1152   NA 145 (H) 07/11/2023 0925   K 4.1 09/07/2023 1152   CL 107 09/07/2023 1152   CO2 26 09/07/2023 1152   BUN 18 09/07/2023 1152   BUN 10 07/11/2023 0925   CREATININE 1.12 (H) 09/07/2023 1152      Component Value Date/Time   CALCIUM 9.5 09/07/2023 1152   ALKPHOS 53 09/07/2023 1152   AST 19 09/07/2023 1152   ALT 18 09/07/2023 1152   BILITOT 0.7 09/07/2023 1152   BILITOT 0.5 07/11/2023 0925      ASSESSMENT & PLAN:  Patient is an 82yo female is here for leukopenia  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 Discussed potential causes including infection, medication, and leukemia, but also mentioned the possibility of a benign genetic condition common in African Americans called Duffy Null. No current signs of infection. -Order comprehensive blood work to assess for nutritional deficiencies and flow cytometry -Plan to monitor white blood cell count and consider bone marrow biopsy if levels continue to drop or if patient develops frequent infections.  Follow-up in 1-2 months to review results of blood work and discuss any changes in symptoms.   Right hip pain Chronic pain following a fall in 2017, with no relief after two back surgeries. Pain is constant and affects mobility and quality of life.  -Order a urine test to rule out a urinary tract infection  as a potential source of pain. -Follow up with orthopedics   Orders Placed This Encounter  Procedures   Ferritin    Standing Status:   Future    Number of Occurrences:   1    Standing Expiration Date:   09/06/2024   Folate    Standing Status:   Future    Number of Occurrences:   1    Standing Expiration Date:   09/06/2024   Vitamin B12    Standing Status:   Future    Number of Occurrences:   1  Standing Expiration Date:   09/06/2024   CBC with Differential/Platelet    Standing Status:   Future    Number of Occurrences:   1    Standing Expiration Date:   09/06/2024   Comprehensive metabolic panel    Standing Status:   Future    Number of Occurrences:   1    Standing Expiration Date:   09/06/2024   Lactate dehydrogenase    Standing Status:   Future    Number of Occurrences:   1    Standing Expiration Date:   09/06/2024   Reticulocytes    Standing Status:   Future    Number of Occurrences:   1    Standing Expiration Date:   09/06/2024   Flow Cytometry, Peripheral Blood (Oncology)    Standing Status:   Future    Number of Occurrences:   1    Standing Expiration Date:   09/06/2024   Copper, serum    Standing Status:   Future    Number of Occurrences:   1    Standing Expiration Date:   09/06/2024   Zinc    Standing Status:   Future    Number of Occurrences:   1    Standing Expiration Date:   09/06/2024   CBC with Differential/Platelet    Standing Status:   Future    Standing Expiration Date:   09/06/2024   Comprehensive metabolic panel    Standing Status:   Future    Standing Expiration Date:   09/06/2024    The total time spent in the appointment was 40 minutes encounter with patients including review of chart and various tests results, discussions about plan of care and coordination of care plan   All questions were answered. The patient knows to call the clinic with any problems, questions or concerns. No barriers to learning was detected.   Cindie Crumbly,  MD 11/13/20244:30 PM

## 2023-09-07 NOTE — Assessment & Plan Note (Signed)
Chronic pain following a fall in 2017, with no relief after two back surgeries. Pain is constant and affects mobility and quality of life.  -Order a urine test to rule out a urinary tract infection as a potential source of pain. -Follow up with orthopedics

## 2023-09-09 LAB — ZINC: Zinc: 94 ug/dL (ref 44–115)

## 2023-09-09 LAB — COPPER, SERUM: Copper: 183 ug/dL — ABNORMAL HIGH (ref 80–158)

## 2023-09-09 LAB — SURGICAL PATHOLOGY

## 2023-09-12 LAB — FLOW CYTOMETRY

## 2023-09-16 ENCOUNTER — Telehealth: Payer: Self-pay | Admitting: Family Medicine

## 2023-09-16 NOTE — Telephone Encounter (Signed)
Copied from CRM [id number]. Topic: Clinical - Medication Refill >> Sep 16, 2023  3:32 PM Orinda Kenner C wrote: Most Recent Primary Care Visit:  Provider: Gilmore Laroche  Department: [facility]  Visit Type: OFFICE VISIT  Date: 07/11/2023  Medication:  Eliquis 5 mg 1 bid   Has the patient contacted their pharmacy? yes (Agent: If no, request that the patient contact the pharmacy for the refill. If patient does not wish to contact the pharmacy document the reason why and proceed with request.) (Agent: If yes, when and what did the pharmacy advise?)  Is this the correct pharmacy for this prescription?  If no, delete pharmacy and type the correct one.  This is the patient's preferred pharmacy:  [id number] [place], [place] - [address] [place] [address] Phone: [phone] Fax: [phone]   Has the prescription been filled recently?   Is the patient out of the medication? Pt is upset and is running out meds, may not have enough rx for upcoming Monday. Pt would like to get the rx today.  Has the patient been seen for an appointment in the last year OR does the patient have an upcoming appointment?   Can we respond through MyChart? Please call pt back at [phone]  Agent: Please be advised that Rx refills may take up to 3 business days. We ask that you follow-up with your pharmacy.

## 2023-09-19 ENCOUNTER — Other Ambulatory Visit: Payer: Self-pay

## 2023-09-19 DIAGNOSIS — I482 Chronic atrial fibrillation, unspecified: Secondary | ICD-10-CM

## 2023-09-19 MED ORDER — APIXABAN 5 MG PO TABS: 5.0000 mg | ORAL_TABLET | Freq: Two times a day (BID) | ORAL | 3 refills | Status: DC

## 2023-09-19 NOTE — Telephone Encounter (Signed)
Refill sent.

## 2023-09-26 ENCOUNTER — Ambulatory Visit (INDEPENDENT_AMBULATORY_CARE_PROVIDER_SITE_OTHER): Payer: Medicare Other

## 2023-09-26 VITALS — Ht 67.0 in | Wt 180.0 lb

## 2023-09-26 DIAGNOSIS — Z0000 Encounter for general adult medical examination without abnormal findings: Secondary | ICD-10-CM | POA: Diagnosis not present

## 2023-09-26 NOTE — Patient Instructions (Signed)
Kayla Vaughn , Thank you for taking time to come for your Medicare Wellness Visit. I appreciate your ongoing commitment to your health goals. Please review the following plan we discussed and let me know if I can assist you in the future.   Referrals/Orders/Follow-Ups/Clinician Recommendations:  You have an order for:  []   2D Mammogram  []   3D Mammogram  [x]   Bone Density   []   Lung Cancer Screening  Please call for appointment:   [facility] [address] [place], [place] [address] [phone]  Make sure to wear two-piece clothing.  No lotions powders or deodorants the day of the appointment Make sure to bring picture ID and insurance card.  Bring list of medications you are currently taking including any supplements.   Schedule your [facility] screening mammogram through MyChart!   Log into your MyChart account.  Go to 'Visit' (or 'Appointments' if on mobile App) --> Schedule an Appointment  Under 'Select a Reason for Visit' choose the Mammogram Screening option.  Complete the pre-visit questions and select the time and place that best fits your schedule.  Next Medicare Annual Wellness Visit: September 26, 2024 at 9:20 am virtual visit  This is a list of the screening recommended for you and due dates:  Health Maintenance  Topic Date Due   COVID-19 Vaccine (1) Never done   Zoster (Shingles) Vaccine (1 of 2) Never done   DTaP/Tdap/Td vaccine (2 - Tdap) 12/25/2007   DEXA scan (bone density measurement)  Never done   Mammogram  06/27/2024   Medicare Annual Wellness Visit  09/25/2024   Pneumonia Vaccine  Completed   Flu Shot  Completed   HPV Vaccine  Aged Out   Hepatitis C Screening  Discontinued    Advanced directives: (Declined) Advance directive discussed with you today. Even though you declined this today, please call our office should you change your mind, and we can give you the proper paperwork for you to fill out.  Next  Medicare Annual Wellness Visit scheduled for next year: Yes  Understanding Your Risk for Falls Millions of people have serious injuries from falls each year. It is important to understand your risk of falling. Talk with your health care provider about your risk and what you can do to lower it. If you do have a serious fall, make sure to tell your provider. Falling once raises your risk of falling again. How can falls affect me? Serious injuries from falls are common. These include: Broken bones, such as hip fractures. Head injuries, such as traumatic brain injuries (TBI) or concussions. A fear of falling can cause you to avoid activities and stay at home. This can make your muscles weaker and raise your risk for a fall. What can increase my risk? There are a number of risk factors that increase your risk for falling. The more risk factors you have, the higher your risk of falling. Serious injuries from a fall happen most often to people who are older than 80 years old. Teenagers and young adults ages 50-29 are also at higher risk. Common risk factors include: Weakness in the lower body. Being generally weak or confused due to long-term (chronic) illness. Dizziness or balance problems. Poor vision. Medicines that cause dizziness or drowsiness. These may include: Medicines for your blood pressure, heart, anxiety, insomnia, or swelling (edema). Pain medicines. Muscle relaxants. Other risk factors include: Drinking alcohol. Having had a fall in the past. Having foot pain or  wearing improper footwear. Working at a dangerous job. Having any of the following in your home: Tripping hazards, such as floor clutter or loose rugs. Poor lighting. Pets. Having dementia or memory loss. What actions can I take to lower my risk of falling?     Physical activity Stay physically fit. Do strength and balance exercises. Consider taking a regular class to build strength and balance. Yoga and tai chi  are good options. Vision Have your eyes checked every year and your prescription for glasses or contacts updated as needed. Shoes and walking aids Wear non-skid shoes. Wear shoes that have rubber soles and low heels. Do not wear high heels. Do not walk around the house in socks or slippers. Use a cane or walker as told by your provider. Home safety Attach secure railings on both sides of your stairs. Install grab bars for your bathtub, shower, and toilet. Use a non-skid mat in your bathtub or shower. Attach bath mats securely with double-sided, non-slip rug tape. Use good lighting in all rooms. Keep a flashlight near your bed. Make sure there is a clear path from your bed to the bathroom. Use night-lights. Do not use throw rugs. Make sure all carpeting is taped or tacked down securely. Remove all clutter from walkways and stairways, including extension cords. Repair uneven or broken steps and floors. Avoid walking on icy or slippery surfaces. Walk on the grass instead of on icy or slick sidewalks. Use ice melter to get rid of ice on walkways in the winter. Use a cordless phone. Questions to ask your health care provider Can you help me check my risk for a fall? Do any of my medicines make me more likely to fall? Should I take a vitamin D supplement? What exercises can I do to improve my strength and balance? Should I make an appointment to have my vision checked? Do I need a bone density test to check for weak bones (osteoporosis)? Would it help to use a cane or a walker? Where to find more information Centers for Disease Control and Prevention, STEADI: TonerPromos.no Community-Based Fall Prevention Programs: TonerPromos.no [employer] on Aging: BaseRingTones.pl Contact a health care provider if: You fall at home. You are afraid of falling at home. You feel weak, drowsy, or dizzy. This information is not intended to replace advice given to you by your health care provider. Make sure you  discuss any questions you have with your health care provider. Document Revised: 06/14/2022 Document Reviewed: 06/14/2022 Elsevier Patient Education  2024 [employer]. Preventive Care 65 Years and Older, Female Preventive care refers to lifestyle choices and visits with your health care provider that can promote health and wellness. Preventive care visits are also called wellness exams. What can I expect for my preventive care visit? Counseling Your health care provider may ask you questions about your: Medical history, including: Past medical problems. Family medical history. Pregnancy and menstrual history. History of falls. Current health, including: Memory and ability to understand (cognition). Emotional well-being. Home life and relationship well-being. Sexual activity and sexual health. Lifestyle, including: Alcohol, nicotine or tobacco, and drug use. Access to firearms. Diet, exercise, and sleep habits. Work and work Astronomer. Sunscreen use. Safety issues such as seatbelt and bike helmet use. Physical exam Your health care provider will check your: Height and weight. These may be used to calculate your BMI (body mass index). BMI is a measurement that tells if you are at a healthy weight. Waist circumference. This measures the distance around  your waistline. This measurement also tells if you are at a healthy weight and may help predict your risk of certain diseases, such as type 2 diabetes and high blood pressure. Heart rate and blood pressure. Body temperature. Skin for abnormal spots. What immunizations do I need?  Vaccines are usually given at various ages, according to a schedule. Your health care provider will recommend vaccines for you based on your age, medical history, and lifestyle or other factors, such as travel or where you work. What tests do I need? Screening Your health care provider may recommend screening tests for certain conditions. This may  include: Lipid and cholesterol levels. Hepatitis C test. Hepatitis B test. HIV (human immunodeficiency virus) test. STI (sexually transmitted infection) testing, if you are at risk. Lung cancer screening. Colorectal cancer screening. Diabetes screening. This is done by checking your blood sugar (glucose) after you have not eaten for a while (fasting). Mammogram. Talk with your health care provider about how often you should have regular mammograms. BRCA-related cancer screening. This may be done if you have a family history of breast, ovarian, tubal, or peritoneal cancers. Bone density scan. This is done to screen for osteoporosis. Talk with your health care provider about your test results, treatment options, and if necessary, the need for more tests. Follow these instructions at home: Eating and drinking  Eat a diet that includes fresh fruits and vegetables, whole grains, lean protein, and low-fat dairy products. Limit your intake of foods with high amounts of sugar, saturated fats, and salt. Take vitamin and mineral supplements as recommended by your health care provider. Do not drink alcohol if your health care provider tells you not to drink. If you drink alcohol: Limit how much you have to 0-1 drink a day. Know how much alcohol is in your drink. In the [place] one drink equals one 12 oz bottle of beer (355 mL), one 5 oz glass of wine (148 mL), or one 1 oz glass of hard liquor (44 mL). Lifestyle Brush your teeth every morning and night with fluoride toothpaste. Floss one time each day. Exercise for at least 30 minutes 5 or more days each week. Do not use any products that contain nicotine or tobacco. These products include cigarettes, chewing tobacco, and vaping devices, such as e-cigarettes. If you need help quitting, ask your health care provider. Do not use drugs. If you are sexually active, practice safe sex. Use a condom or other form of protection in order to prevent STIs. Take  aspirin only as told by your health care provider. Make sure that you understand how much to take and what form to take. Work with your health care provider to find out whether it is safe and beneficial for you to take aspirin daily. Ask your health care provider if you need to take a cholesterol-lowering medicine (statin). Find healthy ways to manage stress, such as: Meditation, yoga, or listening to music. Journaling. Talking to a trusted person. Spending time with friends and family. Minimize exposure to UV radiation to reduce your risk of skin cancer. Safety Always wear your seat belt while driving or riding in a vehicle. Do not drive: If you have been drinking alcohol. Do not ride with someone who has been drinking. When you are tired or distracted. While texting. If you have been using any mind-altering substances or drugs. Wear a helmet and other protective equipment during sports activities. If you have firearms in your house, make sure you follow all gun safety procedures.  What's next? Visit your health care provider once a year for an annual wellness visit. Ask your health care provider how often you should have your eyes and teeth checked. Stay up to date on all vaccines. This information is not intended to replace advice given to you by your health care provider. Make sure you discuss any questions you have with your health care provider. Document Revised: 04/08/2021 Document Reviewed: 04/08/2021 Elsevier Patient Education  2024 [employer].

## 2023-09-26 NOTE — Progress Notes (Signed)
 Because this visit was a virtual/telehealth visit,  certain criteria was not obtained, such a blood pressure, CBG if applicable, and timed get up and go. Any medications not marked as "taking" were not mentioned during the medication reconciliation part of the visit. Any vitals not documented were not able to be obtained due to this being a telehealth visit or patient was unable to self-report a recent blood pressure reading due to a lack of equipment at home via telehealth. Vitals that have been documented are verbally provided by the patient.   Subjective:   Kayla Vaughn is a 82 y.o. female who presents for Medicare Annual (Subsequent) preventive examination.  Visit Complete: Virtual I connected with  Kayla Vaughn on 09/26/23 by a audio enabled telemedicine application and verified that I am speaking with the correct person using two identifiers.  Patient Location: Home  Provider Location: Home Office  I discussed the limitations of evaluation and management by telemedicine. The patient expressed understanding and agreed to proceed.  Vital Signs: Because this visit was a virtual/telehealth visit, some criteria may be missing or patient reported. Any vitals not documented were not able to be obtained and vitals that have been documented are patient reported.  Patient Medicare AWV questionnaire was completed by the patient on na; I have confirmed that all information answered by patient is correct and no changes since this date.  Cardiac Risk Factors include: advanced age (>61men, >61 women);dyslipidemia;hypertension;sedentary lifestyle     Objective:    Today's Vitals   09/26/23 0808 09/26/23 0811  Weight: 180 lb (81.6 kg)   Height: 5\' 7"  (1.702 m)   PainSc:  3    Body mass index is 28.19 kg/m.     09/26/2023    8:08 AM 09/07/2023   11:27 AM 01/10/2023    8:25 AM 01/06/2023    8:42 AM 08/18/2022    8:16 AM 10/05/2021    3:32 PM 09/14/2021    8:19 AM  Advanced Directives   Does Patient Have a Medical Advance Directive? No No No No No No No  Would patient like information on creating a medical advance directive? No - Patient declined No - Patient declined No - Patient declined No - Patient declined Yes (MAU/Ambulatory/Procedural Areas - Information given) No - Patient declined No - Patient declined    Current Medications (verified) Outpatient Encounter Medications as of 09/26/2023  Medication Sig   ALPRAZolam (XANAX) 0.25 MG tablet Take 0.25 mg by mouth daily as needed for anxiety (traveling).   amLODipine (NORVASC) 10 MG tablet Take 1 tablet (10 mg total) by mouth daily.   apixaban (ELIQUIS) 5 MG TABS tablet Take 1 tablet (5 mg total) by mouth 2 (two) times daily.   Cholecalciferol (VITAMIN D) 125 MCG (5000 UT) CAPS Take 5,000 Units by mouth daily.   COMBIGAN 0.2-0.5 % ophthalmic solution Place 1 drop into both eyes every morning.   ketoconazole (NIZORAL) 2 % cream Apply 1 Application topically daily.   latanoprost (XALATAN) 0.005 % ophthalmic solution Place 1 drop into both eyes at bedtime.   lisinopril (ZESTRIL) 20 MG tablet Take 1 tablet (20 mg total) by mouth daily.   Multiple Vitamins-Minerals (WOMENS 50+ MULTI VITAMIN/MIN PO) Take 1 tablet by mouth daily.   Omega-3 Fatty Acids (FISH OIL) 1000 MG CAPS Take 1,000 mg by mouth daily.   rosuvastatin (CRESTOR) 10 MG tablet Take 1 tablet (10 mg total) by mouth daily.   UNABLE TO FIND Med Name: back brace ICD:M51.37  No facility-administered encounter medications on file as of 09/26/2023.    Allergies (verified) Sulfa antibiotics   History: Past Medical History:  Diagnosis Date   Atrial fibrillation (HCC)    Brain aneurysm 1996   Hyperlipemia    Hypertension    Motion sickness    boats, long car rides   Wears dentures    full upper   Past Surgical History:  Procedure Laterality Date   ABDOMINAL HYSTERECTOMY     BACK SURGERY     x2   BRAIN SURGERY  1996   CATARACT EXTRACTION W/PHACO Right  02/06/2021   Procedure: CATARACT EXTRACTION PHACO AND INTRAOCULAR LENS PLACEMENT (IOC) RIGHT 5.43 01:08.1 8.0%;  Surgeon: Lockie Mola, MD;  Location: [facility];  Service: Ophthalmology;  Laterality: Right;   CATARACT EXTRACTION W/PHACO Left 02/18/2021   Procedure: CATARACT EXTRACTION PHACO AND INTRAOCULAR LENS PLACEMENT (IOC) LEFT 8.25 01:13.2 11.2%;  Surgeon: Lockie Mola, MD;  Location: [facility];  Service: Ophthalmology;  Laterality: Left;   CEREBRAL ANEURYSM REPAIR     CHOLECYSTECTOMY     COLONOSCOPY WITH PROPOFOL N/A 01/10/2023   Procedure: COLONOSCOPY WITH PROPOFOL;  Surgeon: Lanelle Bal, DO;  Location: [facility];  Service: Endoscopy;  Laterality: N/A;  10:00 AM, asa 3   Family History  Problem Relation Age of Onset   Breast cancer Maternal Grandmother    Social History   Socioeconomic History   Marital status: Legally Separated    Spouse name: Not on file   Number of children: Not on file   Years of education: Not on file   Highest education level: Not on file  Occupational History   Occupation: retired  Tobacco Use   Smoking status: Former    Current packs/day: 0.00    Average packs/day: 0.3 packs/day for 20.0 years (5.0 ttl pk-yrs)    Types: Cigarettes    Start date: 53    Quit date: 1994    Years since quitting: 30.9    Passive exposure: Past   Smokeless tobacco: Never  Vaping Use   Vaping status: Never Used  Substance and Sexual Activity   Alcohol use: No   Drug use: No   Sexual activity: Not on file  Other Topics Concern   Not on file  Social History Narrative   Not on file   Social Determinants of Health   Financial Resource Strain: Medium Risk (09/26/2023)   Overall Financial Resource Strain (CARDIA)    Difficulty of Paying Living Expenses: Somewhat hard  Food Insecurity: No Food Insecurity (09/26/2023)   Hunger Vital Sign    Worried About Running Out of Food in the Last Year: Never true    Ran Out of Food  in the Last Year: Never true  Transportation Needs: No Transportation Needs (09/26/2023)   PRAPARE - Administrator, Civil Service (Medical): No    Lack of Transportation (Non-Medical): No  Physical Activity: Sufficiently Active (09/26/2023)   Exercise Vital Sign    Days of Exercise per Week: 7 days    Minutes of Exercise per Session: 30 min  Stress: No Stress Concern Present (09/26/2023)   [employer] of Occupational Health - Occupational Stress Questionnaire    Feeling of Stress : Not at all  Social Connections: Moderately Integrated (09/26/2023)   Social Connection and Isolation Panel [NHANES]    Frequency of Communication with Friends and Family: More than three times a week    Frequency of Social Gatherings with Friends and Family: More  than three times a week    Attends Religious Services: More than 4 times per year    Active Member of Clubs or Organizations: Yes    Attends Banker Meetings: Never    Marital Status: Widowed    Tobacco Counseling Counseling given: Yes   Clinical Intake:  Pre-visit preparation completed: Yes  Pain : 0-10 Pain Score: 3  Pain Type: Chronic pain Pain Location: Back Pain Orientation: Lower Pain Radiating Towards: right hip and knee Pain Descriptors / Indicators: Aching, Nagging Pain Onset: More than a month ago Pain Frequency: Constant     BMI - recorded: 28.19 Nutritional Risks: None Diabetes: No  How often do you need to have someone help you when you read instructions, pamphlets, or other written materials from your doctor or pharmacy?: 1 - Never  Interpreter Needed?: No  Information entered by ::  Tyshawn Ciullo, CMA   Activities of Daily Living    09/26/2023    8:23 AM 01/06/2023    8:45 AM  In your present state of health, do you have any difficulty performing the following activities:  Hearing? 0   Vision? 0   Difficulty concentrating or making decisions? 0   Walking or climbing stairs? 1    Comment due to chronic pain   Dressing or bathing? 0   Doing errands, shopping? 0 0  Preparing Food and eating ? N   Using the Toilet? N   In the past six months, have you accidently leaked urine? N   Do you have problems with loss of bowel control? N   Managing your Medications? N   Managing your Finances? N   Housekeeping or managing your Housekeeping? N     Patient Care Team: Gilmore Laroche, FNP as PCP - General (Family Medicine)  Indicate any recent Medical Services you may have received from other than [facility] providers in the past year (date may be approximate).     Assessment:   This is a routine wellness examination for Kayla Vaughn.  Hearing/Vision screen Hearing Screening - Comments:: Patient denies any hearing difficulties.   Vision Screening - Comments:: Wears rx glasses - up to date with routine eye exams with Dr[facility] Lockie [facility] at [facility]   Goals Addressed             This Visit's Progress    Patient Stated       Remain active, healthy, and independent       Depression Screen    09/26/2023    8:16 AM 07/11/2023    8:23 AM 02/22/2023    2:24 PM 08/17/2022    8:16 AM 07/07/2022    1:12 PM 05/25/2022    2:01 PM  PHQ 2/9 Scores  PHQ - 2 Score 0 0 0 0 0 0  PHQ- 9 Score 0 0 0       Fall Risk    09/26/2023    8:23 AM 07/11/2023    8:23 AM 02/22/2023    2:24 PM 08/18/2022    8:18 AM 08/17/2022    8:16 AM  Fall Risk   Falls in the past year? 0 0 0 1 0  Number falls in past yr: 0 0 0 0 0  Injury with Fall? 0 0 0 0 0  Risk for fall due to : No Fall Risks No Fall Risks Other (Comment)  No Fall Risks  Follow up Falls prevention discussed Falls evaluation completed Falls evaluation completed  Falls evaluation completed  MEDICARE RISK AT HOME: Medicare Risk at Home Any stairs in or around the home?: Yes If so, are there any without handrails?: No Home free of loose throw rugs in walkways, pet beds, electrical cords, etc?: Yes Adequate  lighting in your home to reduce risk of falls?: Yes Life alert?: No Use of a cane, walker or w/c?: No Grab bars in the bathroom?: No Shower chair or bench in shower?: Yes Elevated toilet seat or a handicapped toilet?: Yes  TIMED UP AND GO:  Was the test performed?  No    Cognitive Function:        09/26/2023    8:14 AM 08/18/2022    8:20 AM  6CIT Screen  What Year? 0 points 0 points  What month? 0 points 0 points  What time? 0 points 0 points  Count back from 20 0 points 0 points  Months in reverse 0 points 0 points  Repeat phrase 0 points 0 points  Total Score 0 points 0 points    Immunizations Immunization History  Administered Date(s) Administered   Fluad Trivalent(High Dose 65+) 07/11/2023   Influenza, Quadrivalent, Recombinant, Inj, Pf 06/21/2022   PNEUMOCOCCAL CONJUGATE-20 09/23/2021   Td 12/24/1997    TDAP status: Due, Education has been provided regarding the importance of this vaccine. Advised may receive this vaccine at local pharmacy or Health Dept. Aware to provide a copy of the vaccination record if obtained from local pharmacy or Health Dept. Verbalized acceptance and understanding.  Flu Vaccine status: Up to date  Pneumococcal vaccine status: Up to date  Covid-19 vaccine status: Information provided on how to obtain vaccines.   Qualifies for Shingles Vaccine? Yes   Zostavax completed No   Shingrix Completed?: No.    Education has been provided regarding the importance of this vaccine. Patient has been advised to call insurance company to determine out of pocket expense if they have not yet received this vaccine. Advised may also receive vaccine at local pharmacy or Health Dept. Verbalized acceptance and understanding.  Patient states she has had her Shingrix and TDap. Unable to verify those due to not having access to that office.  Screening Tests Health Maintenance  Topic Date Due   COVID-19 Vaccine (1) Never done   Zoster Vaccines- Shingrix (1 of  2) Never done   DTaP/Tdap/Td (2 - Tdap) 12/25/2007   DEXA SCAN  Never done   Medicare Annual Wellness (AWV)  08/19/2023   Pneumonia Vaccine 25+ Years old  Completed   INFLUENZA VACCINE  Completed   HPV VACCINES  Aged Out   Hepatitis C Screening  Discontinued    Health Maintenance  Health Maintenance Due  Topic Date Due   COVID-19 Vaccine (1) Never done   Zoster Vaccines- Shingrix (1 of 2) Never done   DTaP/Tdap/Td (2 - Tdap) 12/25/2007   DEXA SCAN  Never done   Medicare Annual Wellness (AWV)  08/19/2023    Colorectal cancer screening: No longer required.   Mammogram status: Completed 06/28/2023. Repeat every year  Bone Density status: Ordered 07/14/2023. Pt provided with contact info and advised to call to schedule appt.  Lung Cancer Screening: (Low Dose CT Chest recommended if Age 12-80 years, 20 pack-year currently smoking OR have quit w/in 15years.) does not qualify.   Lung Cancer Screening Referral: na  Additional Screening:  Hepatitis C Screening: does not qualify   Vision Screening: Recommended annual ophthalmology exams for early detection of glaucoma and other disorders of the eye. Is the patient up  to date with their annual eye exam?  Yes  Who is the provider or what is the name of the office in which the patient attends annual eye exams? Dr. Lockie Mola If pt is not established with a provider, would they like to be referred to a provider to establish care? No .   Dental Screening: Recommended annual dental exams for proper oral hygiene  Diabetic Foot Exam: na  Community Resource Referral / Chronic Care Management: CRR required this visit?  No   CCM required this visit?  No     Plan:     I have personally reviewed and noted the following in the patient's chart:   Medical and social history Use of alcohol, tobacco or illicit drugs  Current medications and supplements including opioid prescriptions. Patient is not currently taking opioid  prescriptions. Functional ability and status Nutritional status Physical activity Advanced directives List of other physicians Hospitalizations, surgeries, and ER visits in previous 12 months Vitals Screenings to include cognitive, depression, and falls Referrals and appointments  In addition, I have reviewed and discussed with patient certain preventive protocols, quality metrics, and best practice recommendations. A written personalized care plan for preventive services as well as general preventive health recommendations were provided to patient.     Jordan Hawks Arlena Marsan, CMA   09/26/2023   After Visit Summary: (Mail) Due to this being a telephonic visit, the after visit summary with patients personalized plan was offered to patient via mail   Nurse Notes: see routing comment

## 2023-10-06 ENCOUNTER — Other Ambulatory Visit: Payer: Self-pay

## 2023-10-06 ENCOUNTER — Encounter: Payer: Self-pay | Admitting: Emergency Medicine

## 2023-10-06 ENCOUNTER — Emergency Department: Payer: Medicare Other

## 2023-10-06 ENCOUNTER — Emergency Department
Admission: EM | Admit: 2023-10-06 | Discharge: 2023-10-06 | Disposition: A | Discharge status: Home / Self Care | Payer: Medicare Other | Attending: Emergency Medicine | Admitting: Emergency Medicine

## 2023-10-06 DIAGNOSIS — R509 Fever, unspecified: Secondary | ICD-10-CM | POA: Diagnosis not present

## 2023-10-06 DIAGNOSIS — J189 Pneumonia, unspecified organism: Secondary | ICD-10-CM

## 2023-10-06 DIAGNOSIS — I10 Essential (primary) hypertension: Secondary | ICD-10-CM | POA: Diagnosis not present

## 2023-10-06 DIAGNOSIS — J181 Lobar pneumonia, unspecified organism: Secondary | ICD-10-CM | POA: Diagnosis not present

## 2023-10-06 DIAGNOSIS — R070 Pain in throat: Secondary | ICD-10-CM | POA: Diagnosis not present

## 2023-10-06 DIAGNOSIS — J1282 Pneumonia due to coronavirus disease 2019: Secondary | ICD-10-CM | POA: Diagnosis not present

## 2023-10-06 DIAGNOSIS — U071 COVID-19: Secondary | ICD-10-CM | POA: Insufficient documentation

## 2023-10-06 DIAGNOSIS — R918 Other nonspecific abnormal finding of lung field: Secondary | ICD-10-CM | POA: Diagnosis not present

## 2023-10-06 DIAGNOSIS — R059 Cough, unspecified: Secondary | ICD-10-CM | POA: Diagnosis not present

## 2023-10-06 DIAGNOSIS — Z743 Need for continuous supervision: Secondary | ICD-10-CM | POA: Diagnosis not present

## 2023-10-06 DIAGNOSIS — J029 Acute pharyngitis, unspecified: Secondary | ICD-10-CM | POA: Diagnosis not present

## 2023-10-06 DIAGNOSIS — R531 Weakness: Secondary | ICD-10-CM | POA: Diagnosis not present

## 2023-10-06 LAB — GROUP A STREP BY PCR: Group A Strep by PCR: NOT DETECTED

## 2023-10-06 LAB — RESP PANEL BY RT-PCR (RSV, FLU A&B, COVID)  RVPGX2
Influenza A by PCR: NEGATIVE
Influenza B by PCR: NEGATIVE
Resp Syncytial Virus by PCR: NEGATIVE
SARS Coronavirus 2 by RT PCR: POSITIVE — AB

## 2023-10-06 MED ORDER — ACETAMINOPHEN 500 MG PO TABS
1000.0000 mg | ORAL_TABLET | Freq: Once | ORAL | Status: AC
  Administered 2023-10-06: 1000 mg via ORAL
  Filled 2023-10-06: qty 2

## 2023-10-06 MED ORDER — AZITHROMYCIN 250 MG PO TABS: ORAL_TABLET | ORAL | 0 refills | Status: DC

## 2023-10-06 MED ORDER — AZITHROMYCIN 500 MG PO TABS
500.0000 mg | ORAL_TABLET | Freq: Once | ORAL | Status: AC
  Administered 2023-10-06: 500 mg via ORAL
  Filled 2023-10-06: qty 1

## 2023-10-06 MED ORDER — CEFDINIR 300 MG PO CAPS: 300.0000 mg | ORAL_CAPSULE | Freq: Two times a day (BID) | ORAL | 0 refills | Status: AC

## 2023-10-06 MED ORDER — CEFDINIR 300 MG PO CAPS: 300.0000 mg | ORAL_CAPSULE | Freq: Two times a day (BID) | ORAL | 0 refills | Status: DC

## 2023-10-06 NOTE — ED Triage Notes (Addendum)
Cough (productive, clear), chills, subjective fever, sore throat x 2 Denies HA, runny nose Tried tylenol, delsym at home without relief  H/o afib (eliquis)

## 2023-10-06 NOTE — ED Notes (Signed)
See triage note Presents with cough for couple of days  States subjective fever at home but is currently afebrile on arrival to ED Also has had runny nose

## 2023-10-06 NOTE — ED Provider Notes (Signed)
[facility] Provider Note    Event Date/Time   First MD Initiated Contact with Patient 10/06/23 [phone]     (approximate)   History   Cough   HPI  Kayla Vaughn is a 82 y.o. female with a past medical history of atrial fibrillation on anticoagulation, hypertension, hyperlipidemia who presents today for evaluation of cough and sore throat for the past 3-4 days. No chest pain or SOB. No known sick contacts. No headache or neck pain.  Patient Active Problem List   Diagnosis Date Noted   Leukopenia 09/07/2023   Dermatitis 02/22/2023   Cystitis 08/17/2022   Right hip pain 07/07/2022   Degenerative disc disease, lumbar 05/25/2022   Atypical chest pain 09/13/2021   Hyperlipemia    Hypertension    HLD (hyperlipidemia)    Anxiety    Atrial fibrillation, chronic (HCC)    AKI (acute kidney injury) (HCC)    Lightheadedness    TIA (transient ischemic attack) 11/26/2017          Physical Exam   Triage Vital Signs: ED Triage Vitals  Encounter Vitals Group     BP 10/06/23 0857 117/82     Systolic BP Percentile --      Diastolic BP Percentile --      Pulse Rate 10/06/23 0857 66     Resp 10/06/23 0857 16     Temp 10/06/23 0857 98.8 F (37.1 C)     Temp Source 10/06/23 0857 Oral     SpO2 10/06/23 0857 95 %     Weight 10/06/23 0908 178 lb 9.2 oz (81 kg)     Height --      Head Circumference --      Peak Flow --      Pain Score 10/06/23 0908 5     Pain Loc --      Pain Education --      Exclude from Growth Chart --     Most recent vital signs: Vitals:   10/06/23 0857  BP: 117/82  Pulse: 66  Resp: 16  Temp: 98.8 F (37.1 C)  SpO2: 95%    Physical Exam Vitals and nursing note reviewed.  Constitutional:      General: Awake and alert. No acute distress.    Appearance: Normal appearance. The patient is normal weight.  HENT:     Head: Normocephalic and atraumatic.     Mouth: Mucous membranes are moist. Uvula midline.  No tonsillar  exudate.  No soft palate fluctuance.  No trismus.  No voice change.  No sublingual swelling.  No tender cervical lymphadenopathy.  No nuchal rigidity Eyes:     General: PERRL. Normal EOMs        Right eye: No discharge.        Left eye: No discharge.     Conjunctiva/sclera: Conjunctivae normal.  Cardiovascular:     Rate and Rhythm: Normal rate and regular rhythm.     Pulses: Normal pulses.  Pulmonary:     Effort: Pulmonary effort is normal. No respiratory distress. No accessory muscle use. Able to speak in complete sentences.    Breath sounds: Normal breath sounds.  Abdominal:     Abdomen is soft. There is no abdominal tenderness. No rebound or guarding. No distention. Musculoskeletal:        General: No swelling. Normal range of motion.     Cervical back: Normal range of motion and neck supple.  Skin:    General: Skin is warm  and dry.     Capillary Refill: Capillary refill takes less than 2 seconds.     Findings: No rash.  Neurological:     Mental Status: The patient is awake and alert.      ED Results / Procedures / Treatments   Labs (all labs ordered are listed, but only abnormal results are displayed) Labs Reviewed  RESP PANEL BY RT-PCR (RSV, FLU A&B, COVID)  [id number] - Abnormal; Notable for the following components:      Result Value   SARS Coronavirus 2 by RT PCR POSITIVE (*)    All other components within normal limits  GROUP A STREP BY PCR     EKG     RADIOLOGY I independently reviewed and interpreted imaging and agree with radiologists findings.     PROCEDURES:  Critical Care performed:   Procedures   MEDICATIONS ORDERED IN ED: Medications  azithromycin (ZITHROMAX) tablet 500 mg (500 mg Oral Given 10/06/23 1108)  acetaminophen (TYLENOL) tablet 1,000 mg (1,000 mg Oral Given 10/06/23 1120)     IMPRESSION / MDM / ASSESSMENT AND PLAN / ED COURSE  I reviewed the triage vital signs and the nursing notes.   Differential diagnosis includes, but is  not limited to, pneumonia, bronchitis, covid, strep, flu, other URI.  Patient is awake and alert, hemodynamically stable and afebrile. She has a normal O2 saturation of 95% on room air, no increased work of breathing, no accessory muscle use. Able to speak easily in complete sentences.  CXR reveals possible early pneumonia, she was started on abx for CAP. Swab also positive for COVID. This was discussed with patient. She has multiple home medications that interact with Paxlovid. I do not feel she requires hospitalization given normal O2, no desaturation, and no increased work of breathing. Considered low risk CAP and amenable to outpatient treatment and close outpatient follow up per CURB 65 even without BUN. Able to eat/drink and perform ADLs. We discussed isolation instructions except if she requires medical attention. Also discussed strict return precautions. Patient understands and agrees with plan. She was discharged in stable condition.   Patient's presentation is most consistent with acute complicated illness / injury requiring diagnostic workup.    FINAL CLINICAL IMPRESSION(S) / ED DIAGNOSES   Final diagnoses:  COVID  Community acquired pneumonia of left lower lobe of lung     Rx / DC Orders   ED Discharge Orders          Ordered    azithromycin (ZITHROMAX Z-PAK) 250 MG tablet  Status:  Discontinued        10/06/23 1048    cefdinir (OMNICEF) 300 MG capsule  2 times daily,   Status:  Discontinued        10/06/23 1048    azithromycin (ZITHROMAX Z-PAK) 250 MG tablet        10/06/23 1107    cefdinir (OMNICEF) 300 MG capsule  2 times daily        10/06/23 1107             Note:  This document was prepared using Dragon voice recognition software and may include unintentional dictation errors.   Jackelyn Hoehn, PA-C 10/06/23 1446    Janith Lima, MD 10/07/23 [phone]

## 2023-10-06 NOTE — Discharge Instructions (Signed)
You have COVID-19 and also early pneumonia. Take the antibiotics as prescribed for pneumonia. Please remain in isolation due to being covid positive unless you require medical attention. Return for new, worsening, or change in symptoms or other concerns. It was a pleasure caring for you today.

## 2023-10-24 NOTE — Progress Notes (Signed)
 Established Patient Office Visit   Subjective  Patient ID: Kayla Vaughn, female    DOB: July 03, 1941  Age: 82 y.o. MRN: [id number]  Chief Complaint  Patient presents with   Hospitalization Follow-up    She  has a past medical history of Atrial fibrillation (HCC), Brain aneurysm (1996), Hyperlipemia, Hypertension, Motion sickness, and Wears dentures.  HPI Patient presents to the clinic for hospital discharge follow up.  ED Visit Summary: Patient presented with a 3-4-day history of cough and sore throat, without chest pain, shortness of breath, or other concerning symptoms. A chest X-ray suggested early pneumonia, and a swab confirmed COVID-19. Antibiotics for community-acquired pneumonia (CAP) were started. Due to medication interactions, Paxlovid was not prescribed. She was deemed low-risk for hospitalization based on normal oxygen levels and CURB-65 assessment. Isolation instructions and return precautions were reviewed, and the patient agreed to the outpatient treatment plan. Discharged in stable condition.  Review of Systems  Constitutional:  Negative for chills and fever.  Respiratory:  Positive for cough, sputum production and shortness of breath.   Cardiovascular:  Negative for chest pain.  Neurological:  Positive for headaches. Negative for dizziness.      Objective:     BP 118/78   Pulse 60   Ht 5' 7 (1.702 m)   Wt 179 lb (81.2 kg)   SpO2 95%   BMI 28.04 kg/m  BP Readings from Last 3 Encounters:  10/27/23 118/78  10/06/23 117/82  09/07/23 129/89      Physical Exam Vitals reviewed.  Constitutional:      General: She is not in acute distress.    Appearance: Normal appearance. She is not ill-appearing, toxic-appearing or diaphoretic.  HENT:     Head: Normocephalic.     Right Ear: Tympanic membrane normal.     Left Ear: Tympanic membrane normal.     Mouth/Throat:     Pharynx: No oropharyngeal exudate or posterior oropharyngeal erythema.  Eyes:      General:        Right eye: No discharge.        Left eye: No discharge.     Conjunctiva/sclera: Conjunctivae normal.  Cardiovascular:     Rate and Rhythm: Normal rate.     Pulses: Normal pulses.     Heart sounds: Normal heart sounds.  Pulmonary:     Effort: Pulmonary effort is normal. No respiratory distress.     Breath sounds: Normal breath sounds. No wheezing.  Musculoskeletal:     Cervical back: Normal range of motion.  Skin:    General: Skin is warm and dry.     Capillary Refill: Capillary refill takes less than 2 seconds.  Neurological:     Mental Status: She is alert.  Psychiatric:        Mood and Affect: Mood normal.      No results found for any visits on 10/27/23.  The ASCVD Risk score (Arnett DK, et al., 2019) failed to calculate for the following reasons:   The 2019 ASCVD risk score is only valid for ages 83 to 21    Assessment & Plan:  Hospital discharge follow-up Assessment & Plan: Patient recovering from Covid and Pneumonia with lingering symptoms cough and throat irritation. Patient reported completing antibiotic treatments Repeat Chest xray in 6 weeks Sent in Benzonatate  200 mg PRN and Xyzal  5 mg evening  BMP and CBC labs ordered. The hospital chart, including the discharge summary, was thoroughly reviewed Medications were thoroughly reviewed and reconciled with the  patient.    Orders: -     BMP8+eGFR -     CBC with Differential/Platelet -     DG Chest 2 View; Future  Other orders -     Benzonatate ; Take 1 capsule (200 mg total) by mouth 2 (two) times daily as needed for cough.  Dispense: 20 capsule; Refill: 0 -     Levocetirizine Dihydrochloride ; Take 1 tablet (5 mg total) by mouth every evening.  Dispense: 30 tablet; Refill: 1    Return if symptoms worsen or fail to improve.   Hilario Kidd Wilhelmena Falter, FNP

## 2023-10-24 NOTE — Patient Instructions (Addendum)
        Great to see you today.  I have refilled the medication(s) we provide.   Walk in Chest Xray on 1/23    If labs were collected, we will inform you of lab results once received either by echart message or telephone call.   - echart message- for normal results that have been seen by the patient already.   - telephone call: abnormal results or if patient has not viewed results in their echart.   - Please take medications as prescribed. - Follow up with your primary health provider if any health concerns arises. - If symptoms worsen please contact your primary care provider and/or visit the emergency department.

## 2023-10-27 ENCOUNTER — Ambulatory Visit (INDEPENDENT_AMBULATORY_CARE_PROVIDER_SITE_OTHER): Payer: Medicare Other | Admitting: Family Medicine

## 2023-10-27 VITALS — BP 118/78 | HR 60 | Ht 67.0 in | Wt 179.0 lb

## 2023-10-27 DIAGNOSIS — Z8616 Personal history of COVID-19: Secondary | ICD-10-CM | POA: Diagnosis not present

## 2023-10-27 DIAGNOSIS — Z09 Encounter for follow-up examination after completed treatment for conditions other than malignant neoplasm: Secondary | ICD-10-CM | POA: Diagnosis not present

## 2023-10-27 MED ORDER — LEVOCETIRIZINE DIHYDROCHLORIDE 5 MG PO TABS
5.0000 mg | ORAL_TABLET | Freq: Every evening | ORAL | 1 refills | Status: DC
Start: 2023-10-27 — End: 2023-11-17

## 2023-10-27 MED ORDER — BENZONATATE 200 MG PO CAPS: 200.0000 mg | ORAL_CAPSULE | Freq: Two times a day (BID) | ORAL | 0 refills | Status: DC | PRN

## 2023-10-27 NOTE — Assessment & Plan Note (Addendum)
 Patient recovering from Covid and Pneumonia with lingering symptoms cough and throat irritation. Patient reported completing antibiotic treatments Repeat Chest xray in 6 weeks Sent in Benzonatate  200 mg PRN and Xyzal  5 mg evening  BMP and CBC labs ordered. The hospital chart, including the discharge summary, was thoroughly reviewed Medications were thoroughly reviewed and reconciled with the patient.

## 2023-10-28 LAB — CBC WITH DIFFERENTIAL/PLATELET
Basophils Absolute: 0 10*3/uL (ref 0.0–0.2)
Basos: 0 %
EOS (ABSOLUTE): 0.1 10*3/uL (ref 0.0–0.4)
Eos: 4 %
Hematocrit: 41.5 % (ref 34.0–46.6)
Hemoglobin: 12.9 g/dL (ref 11.1–15.9)
Immature Grans (Abs): 0 10*3/uL (ref 0.0–0.1)
Immature Granulocytes: 0 %
Lymphocytes Absolute: 0.9 10*3/uL (ref 0.7–3.1)
Lymphs: 39 %
MCH: 27.9 pg (ref 26.6–33.0)
MCHC: 31.1 g/dL — ABNORMAL LOW (ref 31.5–35.7)
MCV: 90 fL (ref 79–97)
Monocytes Absolute: 0.2 10*3/uL (ref 0.1–0.9)
Monocytes: 9 %
Neutrophils Absolute: 1.1 10*3/uL — ABNORMAL LOW (ref 1.4–7.0)
Neutrophils: 48 %
Platelets: 268 10*3/uL (ref 150–450)
RBC: 4.62 x10E6/uL (ref 3.77–5.28)
RDW: 15 % (ref 11.7–15.4)
WBC: 2.4 10*3/uL — CL (ref 3.4–10.8)

## 2023-10-28 LAB — BMP8+EGFR
BUN/Creatinine Ratio: 12 (ref 12–28)
BUN: 15 mg/dL (ref 8–27)
CO2: 21 mmol/L (ref 20–29)
Calcium: 9.8 mg/dL (ref 8.7–10.3)
Chloride: 109 mmol/L — ABNORMAL HIGH (ref 96–106)
Creatinine, Ser: 1.22 mg/dL — ABNORMAL HIGH (ref 0.57–1.00)
Glucose: 102 mg/dL — ABNORMAL HIGH (ref 70–99)
Potassium: 4 mmol/L (ref 3.5–5.2)
Sodium: 145 mmol/L — ABNORMAL HIGH (ref 134–144)
eGFR: 45 mL/min/{1.73_m2} — ABNORMAL LOW (ref >59)

## 2023-11-01 ENCOUNTER — Inpatient Hospital Stay: Payer: Medicare Other

## 2023-11-02 ENCOUNTER — Inpatient Hospital Stay: Payer: Self-pay | Admitting: Family Medicine

## 2023-11-03 ENCOUNTER — Inpatient Hospital Stay: Payer: Medicare Other | Attending: Oncology

## 2023-11-03 DIAGNOSIS — D709 Neutropenia, unspecified: Secondary | ICD-10-CM

## 2023-11-03 DIAGNOSIS — D72819 Decreased white blood cell count, unspecified: Secondary | ICD-10-CM | POA: Diagnosis not present

## 2023-11-03 LAB — COMPREHENSIVE METABOLIC PANEL
ALT: 19 U/L (ref 0–44)
AST: 21 U/L (ref 15–41)
Albumin: 3.7 g/dL (ref 3.5–5.0)
Alkaline Phosphatase: 48 U/L (ref 38–126)
Anion gap: 8 (ref 5–15)
BUN: 11 mg/dL (ref 8–23)
CO2: 26 mmol/L (ref 22–32)
Calcium: 9.5 mg/dL (ref 8.9–10.3)
Chloride: 105 mmol/L (ref 98–111)
Creatinine, Ser: 1.13 mg/dL — ABNORMAL HIGH (ref 0.44–1.00)
GFR, Estimated: 49 mL/min — ABNORMAL LOW (ref >60)
Glucose, Bld: 86 mg/dL (ref 70–99)
Potassium: 4 mmol/L (ref 3.5–5.1)
Sodium: 139 mmol/L (ref 135–145)
Total Bilirubin: 0.7 mg/dL (ref 0.0–1.2)
Total Protein: 6.7 g/dL (ref 6.5–8.1)

## 2023-11-03 LAB — CBC WITH DIFFERENTIAL/PLATELET
Abs Immature Granulocytes: 0.01 10*3/uL (ref 0.00–0.07)
Basophils Absolute: 0 10*3/uL (ref 0.0–0.1)
Basophils Relative: 1 %
Eosinophils Absolute: 0.1 10*3/uL (ref 0.0–0.5)
Eosinophils Relative: 3 %
HCT: 42.1 % (ref 36.0–46.0)
Hemoglobin: 13.1 g/dL (ref 12.0–15.0)
Immature Granulocytes: 0 %
Lymphocytes Relative: 32 %
Lymphs Abs: 1.1 10*3/uL (ref 0.7–4.0)
MCH: 27.5 pg (ref 26.0–34.0)
MCHC: 31.1 g/dL (ref 30.0–36.0)
MCV: 88.3 fL (ref 80.0–100.0)
Monocytes Absolute: 0.4 10*3/uL (ref 0.1–1.0)
Monocytes Relative: 11 %
Neutro Abs: 1.7 10*3/uL (ref 1.7–7.7)
Neutrophils Relative %: 53 %
Platelets: 227 10*3/uL (ref 150–400)
RBC: 4.77 MIL/uL (ref 3.87–5.11)
RDW: 16.2 % — ABNORMAL HIGH (ref 11.5–15.5)
WBC: 3.3 10*3/uL — ABNORMAL LOW (ref 4.0–10.5)
nRBC: 0 % (ref 0.0–0.2)

## 2023-11-07 ENCOUNTER — Inpatient Hospital Stay: Payer: Medicare Other | Admitting: Oncology

## 2023-11-17 ENCOUNTER — Inpatient Hospital Stay: Payer: Medicare Other | Admitting: Oncology

## 2023-11-17 VITALS — BP 125/75 | HR 69 | Temp 97.5°F | Resp 18 | Ht 67.0 in | Wt 183.0 lb

## 2023-11-17 DIAGNOSIS — D72819 Decreased white blood cell count, unspecified: Secondary | ICD-10-CM | POA: Diagnosis not present

## 2023-11-17 NOTE — Patient Instructions (Signed)
VISIT SUMMARY:  You came in today for a follow-up visit after your recent COVID-19 infection. You reported no recent infections or fevers. We also reviewed your history of a low white blood cell count, which has improved since January. Additionally, you mentioned experiencing arthritis pain and having multiple other healthcare appointments.  YOUR PLAN:  -MILD LEUKOPENIA: Mild leukopenia means having a lower than normal white blood cell count. Your count has improved from 2.4 to 3.3, but it is still slightly below the normal range. We will continue to monitor your white blood cell count. Please return for a follow-up in six months (July 2025).  -ARTHRITIS: Arthritis is a condition that causes pain and inflammation in your joints. You reported severe pain, and we recommend discussing this further with your primary care provider.  INSTRUCTIONS:  Please return for a follow-up visit in six months (July 2025) to monitor your white blood cell count. Additionally, discuss your arthritis pain with your primary care provider at your next appointment.

## 2023-11-17 NOTE — Assessment & Plan Note (Signed)
WBC count improved from 2.4 to 3.3.  Likely lower previously due to COVID infection.  Patient had similar counts since at least 2019.  No recent infections or fevers. Negative flow cytometry and negative workup for nutritional deficiencies. Likely benign ethnic neutropenia. -Continue to monitor WBC count. -Return for follow-up in six months (July 2025).

## 2023-11-17 NOTE — Progress Notes (Signed)
[facility] HEMATOLOGY FOLLOW-UP VISIT  Gilmore Laroche, FNP  REASON FOR FOLLOW-UP: Leukopenia  ASSESSMENT & PLAN:  Patient is an 83 year old female following for leukopenia   Leukopenia WBC count improved from 2.4 to 3.3.  Likely lower previously due to COVID infection.  Patient had similar counts since at least 2019.  No recent infections or fevers. Negative flow cytometry and negative workup for nutritional deficiencies. Likely benign ethnic neutropenia. -Continue to monitor WBC count. -Return for follow-up in six months (July 2025).   Orders Placed This Encounter  Procedures   CBC with Differential/Platelet    Standing Status:   Future    Expected Date:   05/07/2024    Expiration Date:   11/16/2024   Comprehensive metabolic panel    Standing Status:   Future    Expected Date:   05/07/2024    Expiration Date:   11/16/2024    The total time spent in the appointment was 20 minutes encounter with patients including review of chart and various tests results, discussions about plan of care and coordination of care plan   All questions were answered. The patient knows to call the clinic with any problems, questions or concerns. No barriers to learning was detected.  Cindie Crumbly, MD 1/23/20254:31 PM   SUMMARY OF HEMATOLOGIC HISTORY:  Latest Reference Range & Units 11/25/17 18:12 09/10/21 17:17 09/13/21 04:23 09/14/21 07:11 10/05/21 15:34 06/04/22 08:29 07/11/23 09:25 09/07/23 11:52 10/27/23 10:11 11/03/23 11:09  WBC 4.0 - 10.5 K/uL 3.4 (L) 4.0 3.5 (L) 2.6 (L) 3.7 (L) 2.8 (L) 2.5 (LL) 2.4 (L) 2.4 (LL) 3.3 (L)  (LL): Data is critically low (L): Data is abnormally low -Negative flow cytometry -Normal ferritin, vitamin B12, folate, copper and zinc   INTERVAL HISTORY: Kayla Vaughn 83 y.o. female is here for follow-up for leukopenia.She reports no recent infections or fevers. She has a history of a low white blood cell count since 2019,  which was 2.4 in January and has since increased to 3.3.  It was probably lower in January likely secondary to prior COVID infection.  Patient denies weight loss, night sweats, fevers and chills.  I have reviewed the past medical history, past surgical history, social history and family history with the patient   ALLERGIES:  is allergic to sulfa antibiotics.  MEDICATIONS:  Current Outpatient Medications  Medication Sig Dispense Refill   apixaban (ELIQUIS) 5 MG TABS tablet Take 1 tablet (5 mg total) by mouth 2 (two) times daily. 60 tablet 3   Cholecalciferol (VITAMIN D) 125 MCG (5000 UT) CAPS Take 5,000 Units by mouth daily.     COMBIGAN 0.2-0.5 % ophthalmic solution Place 1 drop into both eyes every morning.     latanoprost (XALATAN) 0.005 % ophthalmic solution Place 1 drop into both eyes at bedtime.     Multiple Vitamins-Minerals (WOMENS 50+ MULTI VITAMIN/MIN PO) Take 1 tablet by mouth daily.     Omega-3 Fatty Acids (FISH OIL) 1000 MG CAPS Take 1,000 mg by mouth daily.     ALPRAZolam (XANAX) 0.25 MG tablet Take 0.25 mg by mouth daily as needed for anxiety (traveling). (Patient not taking: Reported on 11/17/2023)     amLODipine (NORVASC) 10 MG tablet Take 1 tablet (10 mg total) by mouth daily. 90 tablet 3   lisinopril (ZESTRIL) 20 MG tablet Take 1 tablet (20 mg total) by mouth daily. 90 tablet 3   No current facility-administered medications for this visit.     REVIEW  OF SYSTEMS:   Constitutional: Denies fevers, chills or night sweats Eyes: Denies blurriness of vision Ears, nose, mouth, throat, and face: Denies mucositis or sore throat Respiratory: Denies cough, dyspnea or wheezes Cardiovascular: Denies palpitation, chest discomfort or lower extremity swelling Gastrointestinal:  Denies nausea, heartburn or change in bowel habits Skin: Denies abnormal skin rashes Lymphatics: Denies new lymphadenopathy or easy bruising Neurological:Denies numbness, tingling or new  weaknesses Behavioral/Psych: Mood is stable, no new changes  All other systems were reviewed with the patient and are negative.  PHYSICAL EXAMINATION:   Vitals:   11/17/23 0858  BP: 125/75  Pulse: 69  Resp: 18  Temp: (!) 97.5 F (36.4 C)  SpO2: 100%    GENERAL:alert, no distress and comfortable LYMPH:  no palpable lymphadenopathy in the cervical, axillary or inguinal LUNGS: clear to auscultation and percussion with normal breathing effort HEART: regular rate & rhythm and no murmurs and no lower extremity edema ABDOMEN:abdomen soft, non-tender and normal bowel sounds Musculoskeletal:no cyanosis of digits and no clubbing  NEURO: alert & oriented x 3 with fluent speech  LABORATORY DATA:  I have reviewed the data as listed  Lab Results  Component Value Date   WBC 3.3 (L) 11/03/2023   NEUTROABS 1.7 11/03/2023   HGB 13.1 11/03/2023   HCT 42.1 11/03/2023   MCV 88.3 11/03/2023   PLT 227 11/03/2023      Component Value Date/Time   NA 139 11/03/2023 1109   NA 145 (H) 10/27/2023 1011   K 4.0 11/03/2023 1109   CL 105 11/03/2023 1109   CO2 26 11/03/2023 1109   GLUCOSE 86 11/03/2023 1109   BUN 11 11/03/2023 1109   BUN 15 10/27/2023 1011   CREATININE 1.13 (H) 11/03/2023 1109   CALCIUM 9.5 11/03/2023 1109   PROT 6.7 11/03/2023 1109   PROT 6.6 07/11/2023 0925   ALBUMIN 3.7 11/03/2023 1109   ALBUMIN 4.3 07/11/2023 0925   AST 21 11/03/2023 1109   ALT 19 11/03/2023 1109   ALKPHOS 48 11/03/2023 1109   BILITOT 0.7 11/03/2023 1109   BILITOT 0.5 07/11/2023 0925   GFRNONAA 49 (L) 11/03/2023 1109   GFRAA >60 11/25/2017 1812    Latest Reference Range & Units 06/04/22 08:29 07/11/23 09:25 09/07/23 11:52 09/07/23 11:53  Ferritin 11 - 307 ng/mL    206  Folate >5.9 ng/mL    24.4  Copper 80 - 158 ug/dL   161 (H)   Vitamin D, 25-Hydroxy 30.0 - 100.0 ng/mL 53.3 58.1    Vitamin B12 180 - 914 pg/mL    477  Zinc 44 - 115 ug/dL   94   (H): Data is abnormally high

## 2023-11-22 DIAGNOSIS — H401123 Primary open-angle glaucoma, left eye, severe stage: Secondary | ICD-10-CM | POA: Diagnosis not present

## 2023-11-29 DIAGNOSIS — H6123 Impacted cerumen, bilateral: Secondary | ICD-10-CM | POA: Diagnosis not present

## 2023-11-29 DIAGNOSIS — Z961 Presence of intraocular lens: Secondary | ICD-10-CM | POA: Diagnosis not present

## 2023-11-29 DIAGNOSIS — H401123 Primary open-angle glaucoma, left eye, severe stage: Secondary | ICD-10-CM | POA: Diagnosis not present

## 2023-11-29 DIAGNOSIS — H43813 Vitreous degeneration, bilateral: Secondary | ICD-10-CM | POA: Diagnosis not present

## 2023-11-29 DIAGNOSIS — H6063 Unspecified chronic otitis externa, bilateral: Secondary | ICD-10-CM | POA: Diagnosis not present

## 2023-11-29 DIAGNOSIS — H401111 Primary open-angle glaucoma, right eye, mild stage: Secondary | ICD-10-CM | POA: Diagnosis not present

## 2023-12-21 DIAGNOSIS — R600 Localized edema: Secondary | ICD-10-CM | POA: Diagnosis not present

## 2023-12-21 DIAGNOSIS — E782 Mixed hyperlipidemia: Secondary | ICD-10-CM | POA: Diagnosis not present

## 2023-12-21 DIAGNOSIS — I5032 Chronic diastolic (congestive) heart failure: Secondary | ICD-10-CM | POA: Diagnosis not present

## 2023-12-21 DIAGNOSIS — R7989 Other specified abnormal findings of blood chemistry: Secondary | ICD-10-CM | POA: Diagnosis not present

## 2023-12-21 DIAGNOSIS — Z8616 Personal history of COVID-19: Secondary | ICD-10-CM | POA: Diagnosis not present

## 2023-12-21 DIAGNOSIS — R0600 Dyspnea, unspecified: Secondary | ICD-10-CM | POA: Diagnosis not present

## 2023-12-21 DIAGNOSIS — E0580 Other thyrotoxicosis without thyrotoxic crisis or storm: Secondary | ICD-10-CM | POA: Diagnosis not present

## 2023-12-21 DIAGNOSIS — Z8679 Personal history of other diseases of the circulatory system: Secondary | ICD-10-CM | POA: Diagnosis not present

## 2023-12-21 DIAGNOSIS — D72819 Decreased white blood cell count, unspecified: Secondary | ICD-10-CM | POA: Diagnosis not present

## 2023-12-21 DIAGNOSIS — I5031 Acute diastolic (congestive) heart failure: Secondary | ICD-10-CM | POA: Diagnosis not present

## 2023-12-21 DIAGNOSIS — H409 Unspecified glaucoma: Secondary | ICD-10-CM | POA: Diagnosis not present

## 2023-12-21 DIAGNOSIS — I5030 Unspecified diastolic (congestive) heart failure: Secondary | ICD-10-CM | POA: Diagnosis not present

## 2023-12-21 DIAGNOSIS — I4821 Permanent atrial fibrillation: Secondary | ICD-10-CM | POA: Diagnosis not present

## 2023-12-21 DIAGNOSIS — I509 Heart failure, unspecified: Secondary | ICD-10-CM | POA: Diagnosis not present

## 2023-12-21 DIAGNOSIS — D638 Anemia in other chronic diseases classified elsewhere: Secondary | ICD-10-CM | POA: Diagnosis not present

## 2023-12-21 DIAGNOSIS — R918 Other nonspecific abnormal finding of lung field: Secondary | ICD-10-CM | POA: Diagnosis not present

## 2023-12-21 DIAGNOSIS — J810 Acute pulmonary edema: Secondary | ICD-10-CM | POA: Diagnosis not present

## 2023-12-21 DIAGNOSIS — Z882 Allergy status to sulfonamides status: Secondary | ICD-10-CM | POA: Diagnosis not present

## 2023-12-21 DIAGNOSIS — R0902 Hypoxemia: Secondary | ICD-10-CM | POA: Diagnosis not present

## 2023-12-21 DIAGNOSIS — I3481 Nonrheumatic mitral (valve) annulus calcification: Secondary | ICD-10-CM | POA: Diagnosis not present

## 2023-12-21 DIAGNOSIS — R252 Cramp and spasm: Secondary | ICD-10-CM | POA: Diagnosis not present

## 2023-12-21 DIAGNOSIS — R053 Chronic cough: Secondary | ICD-10-CM | POA: Diagnosis not present

## 2023-12-21 DIAGNOSIS — E860 Dehydration: Secondary | ICD-10-CM | POA: Diagnosis not present

## 2023-12-21 DIAGNOSIS — R0602 Shortness of breath: Secondary | ICD-10-CM | POA: Diagnosis not present

## 2023-12-21 DIAGNOSIS — J811 Chronic pulmonary edema: Secondary | ICD-10-CM | POA: Diagnosis not present

## 2023-12-21 DIAGNOSIS — Z7901 Long term (current) use of anticoagulants: Secondary | ICD-10-CM | POA: Diagnosis not present

## 2023-12-21 DIAGNOSIS — E870 Hyperosmolality and hypernatremia: Secondary | ICD-10-CM | POA: Diagnosis not present

## 2023-12-21 DIAGNOSIS — E8889 Other specified metabolic disorders: Secondary | ICD-10-CM | POA: Diagnosis not present

## 2023-12-21 DIAGNOSIS — I4891 Unspecified atrial fibrillation: Secondary | ICD-10-CM | POA: Diagnosis not present

## 2023-12-21 DIAGNOSIS — J90 Pleural effusion, not elsewhere classified: Secondary | ICD-10-CM | POA: Diagnosis not present

## 2023-12-21 DIAGNOSIS — I480 Paroxysmal atrial fibrillation: Secondary | ICD-10-CM | POA: Diagnosis not present

## 2023-12-21 DIAGNOSIS — R946 Abnormal results of thyroid function studies: Secondary | ICD-10-CM | POA: Diagnosis not present

## 2023-12-21 DIAGNOSIS — I272 Pulmonary hypertension, unspecified: Secondary | ICD-10-CM | POA: Diagnosis not present

## 2023-12-21 DIAGNOSIS — M7989 Other specified soft tissue disorders: Secondary | ICD-10-CM | POA: Diagnosis not present

## 2023-12-21 DIAGNOSIS — Z87891 Personal history of nicotine dependence: Secondary | ICD-10-CM | POA: Diagnosis not present

## 2023-12-21 DIAGNOSIS — I2489 Other forms of acute ischemic heart disease: Secondary | ICD-10-CM | POA: Diagnosis not present

## 2023-12-21 DIAGNOSIS — I110 Hypertensive heart disease with heart failure: Secondary | ICD-10-CM | POA: Diagnosis not present

## 2023-12-22 DIAGNOSIS — J811 Chronic pulmonary edema: Secondary | ICD-10-CM | POA: Diagnosis not present

## 2023-12-22 DIAGNOSIS — I509 Heart failure, unspecified: Secondary | ICD-10-CM | POA: Diagnosis not present

## 2023-12-26 ENCOUNTER — Telehealth: Payer: Self-pay

## 2023-12-26 NOTE — Transitions of Care (Post Inpatient/ED Visit) (Signed)
 12/26/2023  Name: Kayla Vaughn MRN: [id number] DOB: [birth year]  Today's TOC FU Call Status: Today's TOC FU Call Status:: Successful TOC FU Call Completed TOC FU Call Complete Date: 12/26/23 Patient's Name and Date of Birth confirmed.  Transition Care Management Follow-up Telephone Call Date of Discharge: 12/23/23 Discharge Facility: Other [facility]) Name of Other (Non[facility]) Discharge Facility: [facility] Type of Discharge: Inpatient Admission Primary Inpatient Discharge Diagnosis:: Dyspnea How have you been since you were released from the hospital?: Better (Reports decreased SOB) Any questions or concerns?: No  Items Reviewed: Did you receive and understand the discharge instructions provided?: Yes Medications obtained,verified, and reconciled?: Yes (Medications Reviewed) Any new allergies since your discharge?: No Dietary orders reviewed?: Yes Type of Diet Ordered:: low salt, heart healthy diet.  Medications Reviewed Today: Medications Reviewed Today     Reviewed by Earlie Server, RN (Registered Nurse) on 12/26/23 at 1300  Med List Status: <None>   Medication Order Taking? Sig Documenting Provider Last Dose Status Informant  ALPRAZolam (XANAX) 0.25 MG tablet [id number] No Take 0.25 mg by mouth daily as needed for anxiety (traveling).  Patient not taking: Reported on 11/17/2023   [provider] Not Taking Active Self  amLODipine (NORVASC) 10 MG tablet [id number]  Take 1 tablet (10 mg total) by mouth daily. Gilmore Laroche, FNP  Expired 10/09/23 2359            Med Note (ROSE, Jalexus Brett U   Mon Dec 26, 2023 12:59 PM) Reports she is still taking this medication  apixaban (ELIQUIS) 5 MG TABS tablet [id number] Yes Take 1 tablet (5 mg total) by mouth 2 (two) times daily. Gilmore Laroche, FNP Taking Active   Cholecalciferol (VITAMIN D) 125 MCG (5000 UT) CAPS [id number] Yes Take 5,000 Units by mouth daily. [provider] Taking Active Self  COMBIGAN  0.2-0.5 % ophthalmic solution [id number] Yes Place 1 drop into both eyes every morning. [provider] Taking Active Self  latanoprost (XALATAN) 0.005 % ophthalmic solution [id number] Yes Place 1 drop into both eyes at bedtime. [provider] Taking Active Self  lisinopril (ZESTRIL) 20 MG tablet [id number]  Take 1 tablet (20 mg total) by mouth daily. Gilmore Laroche, FNP  Expired 10/09/23 2359            Med Note (ROSE, Zolton Dowson U   Mon Dec 26, 2023  1:00 PM) Reports that she is still taking this medication  Multiple Vitamins-Minerals (WOMENS 50+ MULTI VITAMIN/MIN PO) [id number] Yes Take 1 tablet by mouth daily. [provider] Taking Active Self  Omega-3 Fatty Acids (FISH OIL) 1000 MG CAPS [id number] Yes Take 1,000 mg by mouth daily. [provider] Taking Active Self           Follow up appointments reviewed: PCP Follow-up appointment confirmed?: Yes Date of PCP follow-up appointment?: 01/05/24 Follow-up Provider: Denny Levy-  Reports that she is going to cancel this appointment and will not go back to [facility]   During call patient reports that she no longer goes to [facility].  I reviewed Heart failure zones and when to take her Lasix.  Patient reports weight is unchanged.  Confirmed that patient has all her medications.  Call not completed due to no [facility].  [facility] notes indicate patient will be following up with Iran Ouch NP.   Interventions Today    Flowsheet Row Most Recent Value  Chronic Disease   Chronic disease during today's visit Congestive Heart Failure (CHF)  General  Interventions   General Interventions Discussed/Reviewed General Interventions Discussed, General Interventions Reviewed  Education Interventions   Education Provided Provided Education  [Reviewed importance of daily weights and when to take her as needed medications]  Nutrition Interventions   Nutrition Discussed/Reviewed Nutrition Discussed,  Decreasing salt  Pharmacy Interventions   Pharmacy Dicussed/Reviewed Medications and their functions      TOC Interventions Today    Flowsheet Row Most Recent Value  TOC Interventions   TOC Interventions Discussed/Reviewed TOC Interventions Discussed, TOC Interventions Reviewed       Lonia Chimera, RN, BSN, CEN Population Health- Transition of Care Team.  [facility] [phone]

## 2023-12-29 ENCOUNTER — Ambulatory Visit: Admitting: Cardiovascular Disease

## 2024-01-04 DIAGNOSIS — I5033 Acute on chronic diastolic (congestive) heart failure: Secondary | ICD-10-CM | POA: Diagnosis not present

## 2024-01-04 DIAGNOSIS — E782 Mixed hyperlipidemia: Secondary | ICD-10-CM | POA: Diagnosis not present

## 2024-01-04 DIAGNOSIS — I10 Essential (primary) hypertension: Secondary | ICD-10-CM | POA: Diagnosis not present

## 2024-01-04 DIAGNOSIS — Z09 Encounter for follow-up examination after completed treatment for conditions other than malignant neoplasm: Secondary | ICD-10-CM | POA: Diagnosis not present

## 2024-01-04 DIAGNOSIS — I4821 Permanent atrial fibrillation: Secondary | ICD-10-CM | POA: Diagnosis not present

## 2024-01-04 DIAGNOSIS — I517 Cardiomegaly: Secondary | ICD-10-CM | POA: Diagnosis not present

## 2024-01-04 DIAGNOSIS — I4891 Unspecified atrial fibrillation: Secondary | ICD-10-CM | POA: Diagnosis not present

## 2024-01-05 ENCOUNTER — Inpatient Hospital Stay: Payer: Self-pay | Admitting: Family Medicine

## 2024-01-09 ENCOUNTER — Ambulatory Visit: Payer: Medicare Other | Admitting: Family Medicine

## 2024-02-15 DIAGNOSIS — I4821 Permanent atrial fibrillation: Secondary | ICD-10-CM | POA: Diagnosis not present

## 2024-02-15 DIAGNOSIS — E782 Mixed hyperlipidemia: Secondary | ICD-10-CM | POA: Diagnosis not present

## 2024-02-15 DIAGNOSIS — I10 Essential (primary) hypertension: Secondary | ICD-10-CM | POA: Diagnosis not present

## 2024-02-15 DIAGNOSIS — I517 Cardiomegaly: Secondary | ICD-10-CM | POA: Diagnosis not present

## 2024-02-15 DIAGNOSIS — I5032 Chronic diastolic (congestive) heart failure: Secondary | ICD-10-CM | POA: Diagnosis not present

## 2024-03-20 ENCOUNTER — Other Ambulatory Visit: Payer: Self-pay | Admitting: Family Medicine

## 2024-03-20 DIAGNOSIS — I482 Chronic atrial fibrillation, unspecified: Secondary | ICD-10-CM

## 2024-03-23 ENCOUNTER — Ambulatory Visit: Admitting: Cardiovascular Disease

## 2024-03-29 DIAGNOSIS — H401123 Primary open-angle glaucoma, left eye, severe stage: Secondary | ICD-10-CM | POA: Diagnosis not present

## 2024-03-29 DIAGNOSIS — H401111 Primary open-angle glaucoma, right eye, mild stage: Secondary | ICD-10-CM | POA: Diagnosis not present

## 2024-03-29 DIAGNOSIS — Z961 Presence of intraocular lens: Secondary | ICD-10-CM | POA: Diagnosis not present

## 2024-04-10 DIAGNOSIS — I10 Essential (primary) hypertension: Secondary | ICD-10-CM | POA: Diagnosis not present

## 2024-04-10 DIAGNOSIS — E785 Hyperlipidemia, unspecified: Secondary | ICD-10-CM | POA: Diagnosis not present

## 2024-04-10 DIAGNOSIS — E038 Other specified hypothyroidism: Secondary | ICD-10-CM | POA: Diagnosis not present

## 2024-04-10 DIAGNOSIS — E559 Vitamin D deficiency, unspecified: Secondary | ICD-10-CM | POA: Diagnosis not present

## 2024-04-10 DIAGNOSIS — Z79899 Other long term (current) drug therapy: Secondary | ICD-10-CM | POA: Diagnosis not present

## 2024-04-12 DIAGNOSIS — M19011 Primary osteoarthritis, right shoulder: Secondary | ICD-10-CM | POA: Diagnosis not present

## 2024-04-12 DIAGNOSIS — S0990XA Unspecified injury of head, initial encounter: Secondary | ICD-10-CM | POA: Diagnosis not present

## 2024-04-12 DIAGNOSIS — Z043 Encounter for examination and observation following other accident: Secondary | ICD-10-CM | POA: Diagnosis not present

## 2024-04-12 DIAGNOSIS — I5032 Chronic diastolic (congestive) heart failure: Secondary | ICD-10-CM | POA: Diagnosis not present

## 2024-04-12 DIAGNOSIS — I110 Hypertensive heart disease with heart failure: Secondary | ICD-10-CM | POA: Diagnosis not present

## 2024-04-12 DIAGNOSIS — S4991XA Unspecified injury of right shoulder and upper arm, initial encounter: Secondary | ICD-10-CM | POA: Diagnosis not present

## 2024-04-12 DIAGNOSIS — E785 Hyperlipidemia, unspecified: Secondary | ICD-10-CM | POA: Diagnosis not present

## 2024-04-12 DIAGNOSIS — M25572 Pain in left ankle and joints of left foot: Secondary | ICD-10-CM | POA: Diagnosis not present

## 2024-04-12 DIAGNOSIS — M25511 Pain in right shoulder: Secondary | ICD-10-CM | POA: Diagnosis not present

## 2024-04-12 DIAGNOSIS — M1990 Unspecified osteoarthritis, unspecified site: Secondary | ICD-10-CM | POA: Diagnosis not present

## 2024-04-12 DIAGNOSIS — I517 Cardiomegaly: Secondary | ICD-10-CM | POA: Diagnosis not present

## 2024-04-12 DIAGNOSIS — M7752 Other enthesopathy of left foot: Secondary | ICD-10-CM | POA: Diagnosis not present

## 2024-04-12 DIAGNOSIS — Z7901 Long term (current) use of anticoagulants: Secondary | ICD-10-CM | POA: Diagnosis not present

## 2024-04-12 DIAGNOSIS — S99912A Unspecified injury of left ankle, initial encounter: Secondary | ICD-10-CM | POA: Diagnosis not present

## 2024-04-12 DIAGNOSIS — Z87891 Personal history of nicotine dependence: Secondary | ICD-10-CM | POA: Diagnosis not present

## 2024-04-12 DIAGNOSIS — Z882 Allergy status to sulfonamides status: Secondary | ICD-10-CM | POA: Diagnosis not present

## 2024-04-12 DIAGNOSIS — M85811 Other specified disorders of bone density and structure, right shoulder: Secondary | ICD-10-CM | POA: Diagnosis not present

## 2024-04-12 DIAGNOSIS — I4891 Unspecified atrial fibrillation: Secondary | ICD-10-CM | POA: Diagnosis not present

## 2024-04-12 DIAGNOSIS — Z66 Do not resuscitate: Secondary | ICD-10-CM | POA: Diagnosis not present

## 2024-04-12 DIAGNOSIS — M85872 Other specified disorders of bone density and structure, left ankle and foot: Secondary | ICD-10-CM | POA: Diagnosis not present

## 2024-04-18 DIAGNOSIS — R7303 Prediabetes: Secondary | ICD-10-CM | POA: Diagnosis not present

## 2024-04-18 DIAGNOSIS — E785 Hyperlipidemia, unspecified: Secondary | ICD-10-CM | POA: Diagnosis not present

## 2024-04-18 DIAGNOSIS — I10 Essential (primary) hypertension: Secondary | ICD-10-CM | POA: Diagnosis not present

## 2024-04-18 DIAGNOSIS — R7989 Other specified abnormal findings of blood chemistry: Secondary | ICD-10-CM | POA: Diagnosis not present

## 2024-04-18 DIAGNOSIS — I4819 Other persistent atrial fibrillation: Secondary | ICD-10-CM | POA: Diagnosis not present

## 2024-05-09 ENCOUNTER — Inpatient Hospital Stay: Payer: Medicare Other

## 2024-05-16 ENCOUNTER — Inpatient Hospital Stay: Payer: Medicare Other | Admitting: Oncology

## 2024-05-21 DIAGNOSIS — H401111 Primary open-angle glaucoma, right eye, mild stage: Secondary | ICD-10-CM | POA: Diagnosis not present

## 2024-05-21 DIAGNOSIS — H401123 Primary open-angle glaucoma, left eye, severe stage: Secondary | ICD-10-CM | POA: Diagnosis not present

## 2024-05-23 DIAGNOSIS — M8580 Other specified disorders of bone density and structure, unspecified site: Secondary | ICD-10-CM | POA: Diagnosis not present

## 2024-05-23 DIAGNOSIS — M2570 Osteophyte, unspecified joint: Secondary | ICD-10-CM | POA: Diagnosis not present

## 2024-05-23 DIAGNOSIS — Z1231 Encounter for screening mammogram for malignant neoplasm of breast: Secondary | ICD-10-CM | POA: Diagnosis not present

## 2024-05-23 DIAGNOSIS — Z780 Asymptomatic menopausal state: Secondary | ICD-10-CM | POA: Diagnosis not present

## 2024-05-23 DIAGNOSIS — Z1212 Encounter for screening for malignant neoplasm of rectum: Secondary | ICD-10-CM | POA: Diagnosis not present

## 2024-06-18 ENCOUNTER — Other Ambulatory Visit: Payer: Self-pay | Admitting: Family Medicine

## 2024-06-18 DIAGNOSIS — I4821 Permanent atrial fibrillation: Secondary | ICD-10-CM | POA: Diagnosis not present

## 2024-06-18 DIAGNOSIS — I5032 Chronic diastolic (congestive) heart failure: Secondary | ICD-10-CM | POA: Diagnosis not present

## 2024-06-18 DIAGNOSIS — I10 Essential (primary) hypertension: Secondary | ICD-10-CM | POA: Diagnosis not present

## 2024-06-18 DIAGNOSIS — E782 Mixed hyperlipidemia: Secondary | ICD-10-CM | POA: Diagnosis not present

## 2024-06-18 DIAGNOSIS — I4891 Unspecified atrial fibrillation: Secondary | ICD-10-CM | POA: Diagnosis not present

## 2024-06-18 DIAGNOSIS — I517 Cardiomegaly: Secondary | ICD-10-CM | POA: Diagnosis not present

## 2024-07-16 ENCOUNTER — Other Ambulatory Visit: Payer: Self-pay | Admitting: Family Medicine

## 2024-07-16 DIAGNOSIS — I10 Essential (primary) hypertension: Secondary | ICD-10-CM

## 2024-07-16 DIAGNOSIS — E7849 Other hyperlipidemia: Secondary | ICD-10-CM

## 2024-07-19 DIAGNOSIS — I10 Essential (primary) hypertension: Secondary | ICD-10-CM | POA: Diagnosis not present

## 2024-07-19 DIAGNOSIS — E038 Other specified hypothyroidism: Secondary | ICD-10-CM | POA: Diagnosis not present

## 2024-07-19 DIAGNOSIS — R7303 Prediabetes: Secondary | ICD-10-CM | POA: Diagnosis not present

## 2024-07-30 ENCOUNTER — Other Ambulatory Visit: Payer: Self-pay | Admitting: Family Medicine

## 2024-07-30 DIAGNOSIS — R7303 Prediabetes: Secondary | ICD-10-CM | POA: Diagnosis not present

## 2024-07-30 DIAGNOSIS — I480 Paroxysmal atrial fibrillation: Secondary | ICD-10-CM | POA: Diagnosis not present

## 2024-07-30 DIAGNOSIS — M25551 Pain in right hip: Secondary | ICD-10-CM | POA: Diagnosis not present

## 2024-07-30 DIAGNOSIS — Z1231 Encounter for screening mammogram for malignant neoplasm of breast: Secondary | ICD-10-CM | POA: Diagnosis not present

## 2024-07-30 DIAGNOSIS — I10 Essential (primary) hypertension: Secondary | ICD-10-CM | POA: Diagnosis not present

## 2024-07-30 DIAGNOSIS — Z23 Encounter for immunization: Secondary | ICD-10-CM | POA: Diagnosis not present

## 2024-09-12 ENCOUNTER — Encounter

## 2024-09-18 ENCOUNTER — Encounter

## 2024-10-01 ENCOUNTER — Ambulatory Visit

## 2024-11-13 ENCOUNTER — Other Ambulatory Visit: Payer: Self-pay | Admitting: *Deleted
